# Patient Record
Sex: Female | Born: 1975 | Race: Black or African American | Hispanic: No | Marital: Married | Smoking: Never smoker
Health system: Southern US, Community
[De-identification: ages and names within clinical notes are randomized; demographics above are authoritative.]

## PROBLEM LIST (undated history)

## (undated) DIAGNOSIS — I10 Essential (primary) hypertension: Secondary | ICD-10-CM

## (undated) DIAGNOSIS — D649 Anemia, unspecified: Secondary | ICD-10-CM

## (undated) HISTORY — PX: BREAST SURGERY: SHX581

## (undated) HISTORY — PX: TREATMENT FISTULA ANAL: SUR1390

## (undated) HISTORY — DX: Anemia, unspecified: D64.9

---

## 2007-03-15 ENCOUNTER — Encounter (INDEPENDENT_AMBULATORY_CARE_PROVIDER_SITE_OTHER): Payer: Self-pay | Admitting: Specialist

## 2007-03-15 ENCOUNTER — Ambulatory Visit (HOSPITAL_BASED_OUTPATIENT_CLINIC_OR_DEPARTMENT_OTHER): Admission: RE | Admit: 2007-03-15 | Discharge: 2007-03-16 | Payer: Self-pay | Admitting: Specialist

## 2007-08-13 ENCOUNTER — Ambulatory Visit (HOSPITAL_COMMUNITY): Admission: RE | Admit: 2007-08-13 | Discharge: 2007-08-13 | Payer: Self-pay | Admitting: Obstetrics

## 2007-08-13 ENCOUNTER — Encounter (INDEPENDENT_AMBULATORY_CARE_PROVIDER_SITE_OTHER): Payer: Self-pay | Admitting: Obstetrics

## 2010-07-16 NOTE — Op Note (Signed)
Tracy Terrell, Tracy Terrell             ACCOUNT NO.:  [id number]   MEDICAL RECORD NO.:  [id number]          PATIENT TYPE:  AMB   LOCATION:  DSC                          FACILITY:  [facility]   PHYSICIAN:  Earvin Hansen L. Truesdale, M.D.DATE OF BIRTH:  Oct 24, 1975   DATE OF PROCEDURE:  03/15/2007  DATE OF DISCHARGE:                               OPERATIVE REPORT   PREOPERATIVE DIAGNOSIS:   POSTOPERATIVE DIAGNOSIS:   OPERATION PERFORMED:  Bilateral breast reductions using the inferior  pedicle technique, bilateral excision of accessory breast tissue.   SURGEON:  Yaakov Guthrie. Shon Hough, M.D.   ASSISTANT:   ANESTHESIA:  General.   The patient underwent general anesthesia after she was drawn for the  inferior pedicle reduction mammoplasty, remarking the nipple areolar  complex from 40 cm up to about 23 cm.   COMPLICATIONS:   INDICATIONS FOR PROCEDURE:  A 35 year old lady with severe, severe  macromastia, back and shoulder pain secondary to large pendulous breasts  resistant to conservative treatment.  She has tried creams, talcs, etc.  to try to prevent intertriginous changes under her breast areas and  medially with fail.  She has increased accessory breast tissue that  transcends around the whole left and right side, axillary and latissimus  dorsi regions.   DESCRIPTION OF PROCEDURE:  After general anesthesia, prep was done to  the chest, breast areas with Hibiclens soap and solution, walled off  with sterile towels and drape so as to make a sterile field.  0.25%  Xylocaine with epinephrine 1:400,000 was injected locally 150 mL per  side.  After sterile prep had been done, the wounds were scored with #15  blades and the skin of the inferior pedicles de-epithelialized with #20  blades, medial and lateral fatty dermal pedicles were excised down to  underlying fascia.  Hemostasis maintained with the Bovie unit on  coagulation.  The new key hole area was also debulked and after proper  hemostasis,  excess lateral accessory breast tissue was removed in large  amounts.  Irrigation was done after proper hemostasis.  The flaps were  transposed and stayed with 3-0 Prolene.  Subcutaneous closure was done  with 3-0 Monocryl x2 layers and a running subcuticular stitch of 3-0  Monocryl and 5-0 Monocryl throughout the inverted T.  The wounds were  drained with #10 fully fluted Blake drains which were placed in the  depths of the wound and brought out through the lateral most portion of  the  incision and secured with 3-0 Prolene.  The wounds were cleansed.  Steri-  Strips and soft dressings were applied including Xeroform, 4 x 4s, ABDs,  and Hypafix tape.  She was then taken to recovery in excellent  condition.  The estimated blood loss less than 150 mL.  Complications  none.      Yaakov Guthrie. Shon Hough, M.D.  Electronically Signed     GLT/MEDQ  D:  03/15/2007  T:  03/15/2007  Job:  [id number]

## 2010-07-16 NOTE — Op Note (Signed)
[name], MCFALL NO.:  [id number]   MEDICAL RECORD NO.:  [id number]          PATIENT TYPE:  AMB   LOCATION:  SDC                           FACILITY:  [facility]   PHYSICIAN:  Lendon Colonel, MD   DATE OF BIRTH:  1975/12/26   DATE OF PROCEDURE:  08/13/2007  DATE OF DISCHARGE:                               OPERATIVE REPORT   PREOPERATIVE DIAGNOSIS:  Eight week and 5-day missed abortion.   POSTOPERATIVE DIAGNOSIS:  Eight week and 5-day missed abortion.   PROCEDURE:  Suction, dilatation, and curettage.   FINDINGS:  An 9 weeks size uterus, moderate amount of POCs, hemostasis  post procedure.   ANESTHESIA:  General plus local.   ANTIBIOTICS:  100 mg IV doxycycline.   EBL:  Minimal.   COMPLICATIONS:  None.   PATHOLOGY:  POCs.   PROCEDURE:  After informed consent was obtained, risks benefits, and  alternatives of the procedure was discussed with the patient.  The  patient was taken to the operating room where general anesthesia was  administered without difficulty.  She was prepped and draped in normal  sterile fashion in dorsal supine lithotomy position.  A bimanual  examination was done to assess the size and position of the uterus.  The  cervix was dilated with Pratt dilators to the #27.  A #9 French suction  curette was inserted into the uterus, just passed through the internal  os without resistance and with three passes, suction was used to remove  the products of conception.  A very gentle sharp curettage was done to  note a gritty cry.  The tenaculum was removed and the procedure was  terminated.   Bimanual examination post procedure revealed smaller uterus and good  hemostasis was noted.  Preprocedure, the patient did receive 1%  lidocaine 5 mL to cervical/paracervical junction at 5 and 7o'clock.  The  patient was awakened from the general anesthesia, having tolerated  procedure well.  Sponge, lap, and needle counts were corrected x3.  The  patient  was taken to recovery room in stable condition.      Lendon Colonel, MD  Electronically Signed     KAF/MEDQ  D:  08/13/2007  T:  08/14/2007  Job:  [id number]

## 2010-11-21 LAB — I-STAT 8, (EC8 V) (CONVERTED LAB)
Acid-Base Excess: 2
Chloride: 104
HCT: 46
Operator id: 123621
Potassium: 3.5
Sodium: 138

## 2010-11-28 LAB — BASIC METABOLIC PANEL
Chloride: 98
Creatinine, Ser: 0.75
GFR calc Af Amer: >60
GFR calc non Af Amer: >60
Potassium: 3.7

## 2010-11-28 LAB — CBC
Platelets: 283
RDW: 14.4
WBC: 8.3

## 2010-11-28 LAB — ABO/RH: ABO/RH(D): O POS

## 2011-09-26 ENCOUNTER — Ambulatory Visit: Payer: Self-pay | Admitting: Gastroenterology

## 2011-12-02 ENCOUNTER — Ambulatory Visit: Payer: Self-pay | Admitting: Gastroenterology

## 2013-01-21 ENCOUNTER — Other Ambulatory Visit: Payer: Self-pay | Admitting: Obstetrics

## 2013-01-24 ENCOUNTER — Encounter (HOSPITAL_COMMUNITY): Payer: Self-pay | Admitting: Pharmacist

## 2013-01-24 ENCOUNTER — Encounter (HOSPITAL_COMMUNITY): Payer: Self-pay | Admitting: *Deleted

## 2013-01-25 ENCOUNTER — Other Ambulatory Visit (HOSPITAL_COMMUNITY): Payer: Self-pay | Admitting: Obstetrics

## 2013-01-26 ENCOUNTER — Encounter (HOSPITAL_COMMUNITY): Payer: Managed Care, Other (non HMO) | Admitting: Anesthesiology

## 2013-01-26 ENCOUNTER — Ambulatory Visit (HOSPITAL_COMMUNITY): Payer: Managed Care, Other (non HMO)

## 2013-01-26 ENCOUNTER — Encounter (HOSPITAL_COMMUNITY): Payer: Self-pay | Admitting: Anesthesiology

## 2013-01-26 ENCOUNTER — Ambulatory Visit (HOSPITAL_COMMUNITY)
Admission: RE | Admit: 2013-01-26 | Discharge: 2013-01-26 | Disposition: A | Discharge status: Home / Self Care | Payer: Managed Care, Other (non HMO) | Source: Ambulatory Visit | Attending: Obstetrics | Admitting: Obstetrics

## 2013-01-26 ENCOUNTER — Ambulatory Visit (HOSPITAL_COMMUNITY): Payer: Managed Care, Other (non HMO) | Admitting: Anesthesiology

## 2013-01-26 ENCOUNTER — Encounter (HOSPITAL_COMMUNITY)
Admission: RE | Disposition: A | Discharge status: Home / Self Care | Payer: Self-pay | Source: Ambulatory Visit | Attending: Obstetrics

## 2013-01-26 DIAGNOSIS — Z302 Encounter for sterilization: Secondary | ICD-10-CM | POA: Insufficient documentation

## 2013-01-26 DIAGNOSIS — O021 Missed abortion: Secondary | ICD-10-CM | POA: Insufficient documentation

## 2013-01-26 HISTORY — PX: LAPAROSCOPIC TUBAL LIGATION: SHX1937

## 2013-01-26 HISTORY — DX: Essential (primary) hypertension: I10

## 2013-01-26 HISTORY — PX: DILATION AND EVACUATION: SHX1459

## 2013-01-26 LAB — CBC
HCT: 40.5 % (ref 36.0–46.0)
Hemoglobin: 13.8 g/dL (ref 12.0–15.0)
MCHC: 34.1 g/dL (ref 30.0–36.0)
RBC: 4.8 MIL/uL (ref 3.87–5.11)

## 2013-01-26 LAB — TYPE AND SCREEN
ABO/RH(D): O POS
Antibody Screen: NEGATIVE

## 2013-01-26 SURGERY — DILATION AND EVACUATION, UTERUS
Anesthesia: General | Wound class: Clean Contaminated

## 2013-01-26 MED ORDER — BUPIVACAINE HCL (PF) 0.25 % IJ SOLN
INTRAMUSCULAR | Status: DC | PRN
  Administered 2013-01-26 (×2): 10 mL

## 2013-01-26 MED ORDER — FENTANYL CITRATE 0.05 MG/ML IJ SOLN
INTRAMUSCULAR | Status: AC
  Filled 2013-01-26: qty 5

## 2013-01-26 MED ORDER — DEXAMETHASONE SODIUM PHOSPHATE 10 MG/ML IJ SOLN
INTRAMUSCULAR | Status: DC | PRN
  Administered 2013-01-26: 10 mg via INTRAVENOUS

## 2013-01-26 MED ORDER — NEOSTIGMINE METHYLSULFATE 1 MG/ML IJ SOLN
INTRAMUSCULAR | Status: AC
  Filled 2013-01-26: qty 1

## 2013-01-26 MED ORDER — DEXAMETHASONE SODIUM PHOSPHATE 10 MG/ML IJ SOLN
INTRAMUSCULAR | Status: AC
  Filled 2013-01-26: qty 1

## 2013-01-26 MED ORDER — FENTANYL CITRATE 0.05 MG/ML IJ SOLN
INTRAMUSCULAR | Status: DC | PRN
  Administered 2013-01-26: 150 ug via INTRAVENOUS
  Administered 2013-01-26 (×2): 50 ug via INTRAVENOUS
  Administered 2013-01-26 (×2): 100 ug via INTRAVENOUS
  Administered 2013-01-26: 50 ug via INTRAVENOUS

## 2013-01-26 MED ORDER — PROPOFOL 10 MG/ML IV BOLUS
INTRAVENOUS | Status: DC | PRN
  Administered 2013-01-26: 200 mg via INTRAVENOUS

## 2013-01-26 MED ORDER — MIDAZOLAM HCL 5 MG/5ML IJ SOLN
INTRAMUSCULAR | Status: DC | PRN
  Administered 2013-01-26: 2 mg via INTRAVENOUS

## 2013-01-26 MED ORDER — PROMETHAZINE HCL 25 MG/ML IJ SOLN: 6.2500 mg | INTRAMUSCULAR | Status: DC | PRN

## 2013-01-26 MED ORDER — FENTANYL CITRATE 0.05 MG/ML IJ SOLN
INTRAMUSCULAR | Status: AC
  Administered 2013-01-26: 25 ug
  Filled 2013-01-26: qty 2

## 2013-01-26 MED ORDER — METHYLERGONOVINE MALEATE 0.2 MG/ML IJ SOLN
INTRAMUSCULAR | Status: DC | PRN
  Administered 2013-01-26: 0.2 mg via INTRAMUSCULAR

## 2013-01-26 MED ORDER — OXYCODONE-ACETAMINOPHEN 5-325 MG PO TABS: 2.0000 | ORAL_TABLET | Freq: Four times a day (QID) | ORAL | Status: DC | PRN

## 2013-01-26 MED ORDER — SODIUM CHLORIDE 0.9 % IJ SOLN
INTRAMUSCULAR | Status: AC
  Filled 2013-01-26: qty 50

## 2013-01-26 MED ORDER — LIDOCAINE HCL (CARDIAC) 20 MG/ML IV SOLN
INTRAVENOUS | Status: AC
  Filled 2013-01-26: qty 5

## 2013-01-26 MED ORDER — ONDANSETRON HCL 4 MG/2ML IJ SOLN
INTRAMUSCULAR | Status: AC
  Filled 2013-01-26: qty 2

## 2013-01-26 MED ORDER — KETOROLAC TROMETHAMINE 30 MG/ML IJ SOLN
INTRAMUSCULAR | Status: AC
  Filled 2013-01-26: qty 1

## 2013-01-26 MED ORDER — METHYLERGONOVINE MALEATE 0.2 MG PO TABS: 0.2000 mg | ORAL_TABLET | Freq: Three times a day (TID) | ORAL | Status: DC

## 2013-01-26 MED ORDER — METHYLERGONOVINE MALEATE 0.2 MG/ML IJ SOLN
INTRAMUSCULAR | Status: AC
  Filled 2013-01-26: qty 1

## 2013-01-26 MED ORDER — GLYCOPYRROLATE 0.2 MG/ML IJ SOLN
INTRAMUSCULAR | Status: DC | PRN
  Administered 2013-01-26: .8 mg via INTRAVENOUS

## 2013-01-26 MED ORDER — PROPOFOL 10 MG/ML IV EMUL
INTRAVENOUS | Status: AC
  Filled 2013-01-26: qty 20

## 2013-01-26 MED ORDER — DOXYCYCLINE HYCLATE 100 MG IV SOLR
100.0000 mg | Freq: Once | INTRAVENOUS | Status: AC
  Administered 2013-01-26: 100 mg via INTRAVENOUS
  Filled 2013-01-26: qty 100

## 2013-01-26 MED ORDER — MIDAZOLAM HCL 2 MG/2ML IJ SOLN
INTRAMUSCULAR | Status: AC
  Filled 2013-01-26: qty 2

## 2013-01-26 MED ORDER — BUPIVACAINE HCL (PF) 0.25 % IJ SOLN
INTRAMUSCULAR | Status: AC
  Filled 2013-01-26: qty 30

## 2013-01-26 MED ORDER — SILVER NITRATE-POT NITRATE 75-25 % EX MISC
CUTANEOUS | Status: AC
  Filled 2013-01-26: qty 1

## 2013-01-26 MED ORDER — LACTATED RINGERS IV SOLN
INTRAVENOUS | Status: DC
  Administered 2013-01-26 (×3): via INTRAVENOUS

## 2013-01-26 MED ORDER — GLYCOPYRROLATE 0.2 MG/ML IJ SOLN
INTRAMUSCULAR | Status: AC
  Filled 2013-01-26: qty 4

## 2013-01-26 MED ORDER — LIDOCAINE HCL 1 % IJ SOLN
INTRAMUSCULAR | Status: AC
  Filled 2013-01-26: qty 20

## 2013-01-26 MED ORDER — KETOROLAC TROMETHAMINE 30 MG/ML IJ SOLN
INTRAMUSCULAR | Status: DC | PRN
  Administered 2013-01-26: 30 mg via INTRAVENOUS

## 2013-01-26 MED ORDER — LIDOCAINE HCL (CARDIAC) 20 MG/ML IV SOLN
INTRAVENOUS | Status: DC | PRN
  Administered 2013-01-26: 60 mg via INTRAVENOUS

## 2013-01-26 MED ORDER — MIDAZOLAM HCL 2 MG/2ML IJ SOLN: 0.5000 mg | Freq: Once | INTRAMUSCULAR | Status: DC | PRN

## 2013-01-26 MED ORDER — ONDANSETRON HCL 4 MG/2ML IJ SOLN
INTRAMUSCULAR | Status: DC | PRN
  Administered 2013-01-26: 4 mg via INTRAVENOUS

## 2013-01-26 MED ORDER — FENTANYL CITRATE 0.05 MG/ML IJ SOLN
25.0000 ug | INTRAMUSCULAR | Status: DC | PRN
  Administered 2013-01-26 (×3): 25 ug via INTRAVENOUS

## 2013-01-26 MED ORDER — NEOSTIGMINE METHYLSULFATE 1 MG/ML IJ SOLN
INTRAMUSCULAR | Status: DC | PRN
  Administered 2013-01-26: 4 mg via INTRAVENOUS

## 2013-01-26 MED ORDER — ROCURONIUM BROMIDE 100 MG/10ML IV SOLN
INTRAVENOUS | Status: DC | PRN
  Administered 2013-01-26: 10 mg via INTRAVENOUS
  Administered 2013-01-26: 50 mg via INTRAVENOUS

## 2013-01-26 SURGICAL SUPPLY — 40 items
BLADE SURG 15 STRL LF C SS BP (BLADE) ×2 IMPLANT
BLADE SURG 15 STRL SS (BLADE) ×1
CATH ROBINSON RED A/P 16FR (CATHETERS) ×3 IMPLANT
CHLORAPREP W/TINT 26ML (MISCELLANEOUS) ×3 IMPLANT
CLOTH BEACON ORANGE TIMEOUT ST (SAFETY) ×3 IMPLANT
DECANTER SPIKE VIAL GLASS SM (MISCELLANEOUS) IMPLANT
DERMABOND ADHESIVE PROPEN (GAUZE/BANDAGES/DRESSINGS) ×1
DERMABOND ADVANCED (GAUZE/BANDAGES/DRESSINGS) ×1
DERMABOND ADVANCED .7 DNX12 (GAUZE/BANDAGES/DRESSINGS) ×2 IMPLANT
DERMABOND ADVANCED .7 DNX6 (GAUZE/BANDAGES/DRESSINGS) ×2 IMPLANT
GLOVE BIO SURGEON STRL SZ 6.5 (GLOVE) ×3 IMPLANT
GLOVE BIOGEL PI IND STRL 7.0 (GLOVE) ×4 IMPLANT
GLOVE BIOGEL PI INDICATOR 7.0 (GLOVE) ×2
GOWN PREVENTION PLUS LG XLONG (DISPOSABLE) ×6 IMPLANT
GOWN STRL REIN XL XLG (GOWN DISPOSABLE) ×6 IMPLANT
KIT BERKELEY 1ST TRIMESTER 3/8 (MISCELLANEOUS) ×3 IMPLANT
MANIPULATOR UTERINE 4.5 ZUMI (MISCELLANEOUS) ×3 IMPLANT
NEEDLE INSUFFLATION 120MM (ENDOMECHANICALS) ×3 IMPLANT
NEEDLE SPNL 22GX3.5 QUINCKE BK (NEEDLE) ×3 IMPLANT
NS IRRIG 1000ML POUR BTL (IV SOLUTION) ×3 IMPLANT
PACK LAPAROSCOPY BASIN (CUSTOM PROCEDURE TRAY) ×3 IMPLANT
PACK VAGINAL MINOR WOMEN LF (CUSTOM PROCEDURE TRAY) ×3 IMPLANT
PAD OB MATERNITY 4.3X12.25 (PERSONAL CARE ITEMS) ×3 IMPLANT
PAD PREP 24X48 CUFFED NSTRL (MISCELLANEOUS) ×3 IMPLANT
RING FALLOPIAN BANDS (Ring) ×6 IMPLANT
SET BERKELEY SUCTION TUBING (SUCTIONS) ×3 IMPLANT
SUT VICRYL 0 UR6 27IN ABS (SUTURE) ×3 IMPLANT
SUT VICRYL 4-0 PS2 18IN ABS (SUTURE) ×6 IMPLANT
SYR CONTROL 10ML LL (SYRINGE) ×3 IMPLANT
SYRINGE 10CC LL (SYRINGE) ×3 IMPLANT
TOWEL OR 17X24 6PK STRL BLUE (TOWEL DISPOSABLE) ×6 IMPLANT
TROCAR XCEL NON BLADE 8MM B8LT (ENDOMECHANICALS) ×3 IMPLANT
TROCAR XCEL NON-BLD 5MMX100MML (ENDOMECHANICALS) ×3 IMPLANT
VACURETTE 10 RIGID CVD (CANNULA) IMPLANT
VACURETTE 14MM CVD 1/2 BASE (CANNULA) IMPLANT
VACURETTE 7MM CVD STRL WRAP (CANNULA) ×3 IMPLANT
VACURETTE 8 RIGID CVD (CANNULA) IMPLANT
VACURETTE 9 RIGID CVD (CANNULA) IMPLANT
WARMER LAPAROSCOPE (MISCELLANEOUS) ×3 IMPLANT
WATER STERILE IRR 1000ML POUR (IV SOLUTION) ×3 IMPLANT

## 2013-01-26 NOTE — Anesthesia Preprocedure Evaluation (Signed)
Anesthesia Evaluation  Patient identified by MRN, date of birth, ID band Patient awake    Reviewed: Allergy & Precautions, H&P , Patient's Chart, lab work & pertinent test results, reviewed documented beta blocker date and time   History of Anesthesia Complications Negative for: history of anesthetic complications  Airway Mallampati: II TM Distance: >3 FB Neck ROM: full    Dental   Pulmonary  breath sounds clear to auscultation        Cardiovascular Exercise Tolerance: Good hypertension, Rhythm:regular Rate:Normal     Neuro/Psych    GI/Hepatic   Endo/Other    Renal/GU      Musculoskeletal   Abdominal   Peds  Hematology   Anesthesia Other Findings   Reproductive/Obstetrics                           Anesthesia Physical Anesthesia Plan  ASA: II  Anesthesia Plan: General ETT   Post-op Pain Management:    Induction:   Airway Management Planned:   Additional Equipment:   Intra-op Plan:   Post-operative Plan:   Informed Consent: I have reviewed the patients History and Physical, chart, labs and discussed the procedure including the risks, benefits and alternatives for the proposed anesthesia with the patient or authorized representative who has indicated his/her understanding and acceptance.   Dental Advisory Given  Plan Discussed with: CRNA and Surgeon  Anesthesia Plan Comments:         Anesthesia Quick Evaluation  

## 2013-01-26 NOTE — Brief Op Note (Signed)
01/26/2013  1:20 PM  PATIENT:  Tracy Terrell  37 y.o. female  PRE-OPERATIVE DIAGNOSIS:  Missed Abortion, Desires Sterilization  [phone], [phone]  POST-OPERATIVE DIAGNOSIS:  MISSED ABORTION, DESIRES STERILIZATION  PROCEDURE:  Procedure(s): DILATATION AND EVACUATION WITH OPERATIVE ULTRASOUND (N/A) LAPAROSCOPIC TUBAL LIGATION (Bilateral)  SURGEON:  Surgeon(s) and Role:    * Jolyne Laye A. Ernestina Penna, MD - Primary  PHYSICIAN ASSISTANT:   ASSISTANTS: none   ANESTHESIA:   general  EBL:  Total I/O In: 2250 [I.V.:2250] Out: 175 [Urine:100; Blood:75]  BLOOD ADMINISTERED:none  DRAINS: Urinary Catheter (Foley)   LOCAL MEDICATIONS USED:  MARCAINE     SPECIMEN:  Source of Specimen:  POC's  DISPOSITION OF SPECIMEN:  PATHOLOGY  COUNTS:  YES  TOURNIQUET:  * No tourniquets in log *  DICTATION: .Note written in EPIC  PLAN OF CARE: Admit to inpatient   PATIENT DISPOSITION:  PACU - hemodynamically stable.   Delay start of Pharmacological VTE agent (>24hrs) due to surgical blood loss or risk of bleeding: yes

## 2013-01-26 NOTE — H&P (Signed)
Babs Dabbs is a 37 y.o. [id number] with MAB presenting for D&C. Pt also desires sterilization by lap TL at time of D&C. Known fibroids. H/o htn, currently on no meds.    Past Medical History  Diagnosis Date  . Hypertension     controlled currently w/o meds   Past Surgical History  Procedure Laterality Date  . Breast surgery    . Treatment fistula anal     Family History: family history is not on file. Social History:  reports that she has never smoked. She does not have any smokeless tobacco history on file. She reports that she does not drink alcohol or use illicit drugs.  PMH: fibroids, htn   Meds: PNV Allergies: ASA, Demerol  Review of Systems - Negative except none     Blood pressure 125/74, pulse 102, temperature 98.6 F (37 C), temperature source Oral, resp. rate 20, height 5' (1.524 m), weight 97.07 kg (214 lb), SpO2 100.00%.  Physical Exam:  Gen: well appearing, no distress  Back: no CVAT Abd:  NT, no RUQ pain LE: no edema, equal bilaterally, non-tender GU: deferred to OR    Assessment/Plan: 37 y.o. No obstetric history on file. here for u/s guided D&C for MAB and lap TL for undesired fertility R/B reviewed w/ pt. Proceed as planned   Rasheen Schewe A. 01/26/2013, 9:36 AM

## 2013-01-26 NOTE — Op Note (Signed)
01/26/2013  1:20 PM  PATIENT:  Tracy Terrell  37 y.o. female  PRE-OPERATIVE DIAGNOSIS:  Missed Abortion, Desires Sterilization  [phone], [phone]  POST-OPERATIVE DIAGNOSIS:  MISSED ABORTION, DESIRES STERILIZATION  PROCEDURE:  Procedure(s): DILATATION AND EVACUATION WITH OPERATIVE ULTRASOUND (N/A) LAPAROSCOPIC TUBAL LIGATION (Bilateral)  SURGEON:  Surgeon(s) and Role:    * Isabelle Matt A. Ernestina Penna, MD - Primary  PHYSICIAN ASSISTANT:   ASSISTANTS: none   ANESTHESIA:   general  EBL:  Total I/O In: 2250 [I.V.:2250] Out: 175 [Urine:100; Blood:75]  BLOOD ADMINISTERED:none  DRAINS: Urinary Catheter (Foley)   LOCAL MEDICATIONS USED:  MARCAINE     SPECIMEN:  Source of Specimen:  POC's  DISPOSITION OF SPECIMEN:  PATHOLOGY  COUNTS:  YES  TOURNIQUET:  * No tourniquets in log *  DICTATION: .Note written in EPIC  PLAN OF CARE: Admit to inpatient   PATIENT DISPOSITION:  PACU - hemodynamically stable.   Delay start of Pharmacological VTE agent (>24hrs) due to surgical blood loss or risk of bleeding: yes  Antibiotics: 100 mg IV doxycycline EBL: Minimal, prior anesthesia Complications: None Findings: Fundus palpable to the umbilicus, multiple fibroids in uterus, 9 weeks failed IUP, thickened endometrial stripe with hemostasis postpartum. By laparoscopy, normal bilateral ovaries, normal bilateral tubes, normal liver edge and gallbladder, markedly enlarged fibroid uterus.  Indications: This is a 37 year old G4 P1 031 with 9 week missed AB and multiple fibroids who presents for ultrasound-guided D&E of missed abortion and sterilization via laparoscopic tubal ligation. Risks and benefits along with alternatives have been discussed with patient previously.  Procedure: After informed consent was obtained and discussion of alternatives were reviewed, patient was taken to the operating room where general anesthesia was initiated without difficulty she was prepped and draped in the normal sterile  fashion in dorsal lithotomy. A speculum was placed in the vagina although Several different speculums were tried due to the redundant vaginal tissue to to patient's obesity and the length of the vagina. Exposure was limited but best obtained with a medium weighted speculum and a Deaver to retract the anterior vaginal l wall. At this point the cervix was visualized and grasped with tenaculum. 10 cc of quarter percent Marcaine were infused at 5 and 7:00 the cervical paracervical junction and Betadine was applied. Using ultrasound guidance the cervix was easily dilated to a #27 Pratt dilator. This was only accomplished with retraction on the cervix. Even with this and with fundal pressure to push the uterus into the lower pelvis,  the full length of the dilators were inside the patient's vagina due to the length of the vagina the lip of the cervix and the length of the uterus. Under ultrasound guidance a 9 French suction curet was advanced into the lower uterine segment. Ultrasound was used to guide the suction curet past the lower segment fibroid and into the sac of the pregnancy. Suction was turned on however the suction curet was entirely within the vagina and any manipulation of the suction curet caused release of the suction valve. Eventually a piece of Tegaderm was used to secure the closure device over the valve so it would not move and it intravaginal position. Several passes with the suction curet were done to complete the suction curettage. A sharp curettage was also carried out under ultrasound guidance. Thinned endometrial stripe with no active flow was noted as well as hemostasis at the end of the procedure. The patient tolerated the procedure well.  Townsend gloves were changed and the patient was reprepped and draped  for the laparoscopic tubal ligation. A ZUMI uterine manipulator was placed into the uterus with oh given the length of the cervix I believe this device remains within the cervix and  limited R. uterine manipulation. A Foley catheter was also placed. A 5 mm skin incision was made above the umbilicus all fold and a varies needle was inserted into the peritoneal cavity intraperitoneal placement was confirmed with the use of a saline filled syringe and pneumoperitoneum to 15 mmHg was created. A 5 mm Optiview non-bladed trocar was then used to enter the pelvis. Intraperitoneal placement was confirmed with the use of the laparoscope a pneumoperitoneum was maintained brief survey of the abdomen and pelvis revealed normal liver edge, normal gallbladder, normal bilateral tubes and ovaries, markedly enlarged uterus at the level of the umbilicus. An 8 mm non-bladed trocar was placed in the suprapubic incision. Using blunt probes and limited uterine manipulation from the ZUMI the bilateral fallopian tubes are carried out to the fimbriated end. Using Falope ring applicator the right tube was grasped and a section of tube was pulled up into the Falope ring applicator. At the conclusion of the application I was concerned that we did not get full thickness of the fallopian tube in the second ring was applied with excellent effect. The left tube was pulled up into the Falope ring and application of the Falope ring with good blanching and a good knuckle of tube pulled up into the ring. Inspection of the pelvis then revealed a small puncture wounds on the back of the uterus. This was thought to be from initial entry with the varies needle. Pneumoperitoneum was completely released and no bleeding was noted from the site hemostasis was assured. 60 cc of saline was inserted into the pelvis and then suctioned out with the removal of a single clot. The posterior cul-de-sac was not able to be evaluated due to the size of the uterus and the limited mobility with the ZUMI manipulator. The anterior cul-de-sac was clear. The procedure was then terminated. Pneumoperitoneum was again released trochars were removed. The skin  sites were closed with 4-0 Monocryl after assuring hemostasis of the skin sites.  The ZUMI manipulator was removed. No vaginal bleeding was noted. The procedure was terminated. Sponge lap needle counts were correct x3 and patient was taken to recovery room in a stable condition.  Tracy Terrell A. 01/26/2013 1:33 PM

## 2013-01-26 NOTE — Anesthesia Postprocedure Evaluation (Signed)
  Anesthesia Post-op Note  Anesthesia Post Note  Patient: Tracy Terrell  Procedure(s) Performed: Procedure(s) (LRB): DILATATION AND EVACUATION WITH OPERATIVE ULTRASOUND (N/A) LAPAROSCOPIC TUBAL LIGATION (Bilateral)  Anesthesia type: General  Patient location: PACU  Post pain: Pain level controlled  Post assessment: Post-op Vital signs reviewed  Last Vitals:  Filed Vitals:   01/26/13 1315  BP: 143/90  Pulse: 100  Temp: 37.4 C  Resp: 16    Post vital signs: Reviewed  Level of consciousness: sedated  Complications: No apparent anesthesia complications

## 2013-01-26 NOTE — Transfer of Care (Signed)
Immediate Anesthesia Transfer of Care Note  Patient: Tracy Terrell  Procedure(s) Performed: Procedure(s): DILATATION AND EVACUATION WITH OPERATIVE ULTRASOUND (N/A) LAPAROSCOPIC TUBAL LIGATION (Bilateral)  Patient Location: PACU  Anesthesia Type:General  Level of Consciousness: awake  Airway & Oxygen Therapy: Patient Spontanous Breathing and Patient connected to nasal cannula oxygen  Post-op Assessment: Report given to PACU RN and Post -op Vital signs reviewed and stable  Post vital signs: stable  Complications: No apparent anesthesia complications

## 2013-01-27 ENCOUNTER — Encounter (HOSPITAL_COMMUNITY): Payer: Self-pay | Admitting: Obstetrics

## 2018-11-25 DIAGNOSIS — Z800 Family history of malignant neoplasm of digestive organs: Secondary | ICD-10-CM | POA: Insufficient documentation

## 2018-11-25 DIAGNOSIS — D500 Iron deficiency anemia secondary to blood loss (chronic): Secondary | ICD-10-CM | POA: Insufficient documentation

## 2018-11-25 DIAGNOSIS — K649 Unspecified hemorrhoids: Secondary | ICD-10-CM | POA: Insufficient documentation

## 2018-11-25 DIAGNOSIS — N924 Excessive bleeding in the premenopausal period: Secondary | ICD-10-CM | POA: Insufficient documentation

## 2018-11-28 DIAGNOSIS — Z9851 Tubal ligation status: Secondary | ICD-10-CM | POA: Insufficient documentation

## 2018-12-09 DIAGNOSIS — D509 Iron deficiency anemia, unspecified: Secondary | ICD-10-CM | POA: Insufficient documentation

## 2020-01-19 LAB — RESULTS CONSOLE HPV: CHL HPV: POSITIVE

## 2020-01-19 LAB — HM PAP SMEAR

## 2020-09-21 ENCOUNTER — Other Ambulatory Visit: Payer: Self-pay

## 2020-09-21 ENCOUNTER — Emergency Department (HOSPITAL_COMMUNITY)
Admission: EM | Admit: 2020-09-21 | Discharge: 2020-09-22 | Disposition: A | Discharge status: Home / Self Care | Payer: BC Managed Care – PPO | Attending: Emergency Medicine | Admitting: Emergency Medicine

## 2020-09-21 ENCOUNTER — Encounter (HOSPITAL_COMMUNITY): Payer: Self-pay | Admitting: Emergency Medicine

## 2020-09-21 ENCOUNTER — Emergency Department (HOSPITAL_COMMUNITY): Payer: BC Managed Care – PPO

## 2020-09-21 DIAGNOSIS — R0602 Shortness of breath: Secondary | ICD-10-CM | POA: Diagnosis not present

## 2020-09-21 DIAGNOSIS — R0789 Other chest pain: Secondary | ICD-10-CM

## 2020-09-21 DIAGNOSIS — E041 Nontoxic single thyroid nodule: Secondary | ICD-10-CM | POA: Diagnosis not present

## 2020-09-21 DIAGNOSIS — I10 Essential (primary) hypertension: Secondary | ICD-10-CM | POA: Diagnosis not present

## 2020-09-21 DIAGNOSIS — U071 COVID-19: Secondary | ICD-10-CM

## 2020-09-21 DIAGNOSIS — R7989 Other specified abnormal findings of blood chemistry: Secondary | ICD-10-CM | POA: Diagnosis not present

## 2020-09-21 DIAGNOSIS — R911 Solitary pulmonary nodule: Secondary | ICD-10-CM | POA: Diagnosis not present

## 2020-09-21 LAB — COMPREHENSIVE METABOLIC PANEL
ALT: 28 U/L (ref 0–44)
AST: 38 U/L (ref 15–41)
Albumin: 3.5 g/dL (ref 3.5–5.0)
Alkaline Phosphatase: 89 U/L (ref 38–126)
Anion gap: 6 (ref 5–15)
BUN: 14 mg/dL (ref 6–20)
CO2: 23 mmol/L (ref 22–32)
Calcium: 8.3 mg/dL — ABNORMAL LOW (ref 8.9–10.3)
Chloride: 106 mmol/L (ref 98–111)
Creatinine, Ser: 0.94 mg/dL (ref 0.44–1.00)
GFR, Estimated: >60 mL/min (ref >60)
Glucose, Bld: 103 mg/dL — ABNORMAL HIGH (ref 70–99)
Potassium: 3 mmol/L — ABNORMAL LOW (ref 3.5–5.1)
Sodium: 135 mmol/L (ref 135–145)
Total Bilirubin: 0.2 mg/dL — ABNORMAL LOW (ref 0.3–1.2)
Total Protein: 7.8 g/dL (ref 6.5–8.1)

## 2020-09-21 LAB — CBC WITH DIFFERENTIAL/PLATELET
Abs Immature Granulocytes: 0.02 10*3/uL (ref 0.00–0.07)
Basophils Absolute: 0 10*3/uL (ref 0.0–0.1)
Basophils Relative: 1 %
Eosinophils Absolute: 0.1 10*3/uL (ref 0.0–0.5)
Eosinophils Relative: 1 %
HCT: 29.7 % — ABNORMAL LOW (ref 36.0–46.0)
Hemoglobin: 8.7 g/dL — ABNORMAL LOW (ref 12.0–15.0)
Immature Granulocytes: 0 %
Lymphocytes Relative: 22 %
Lymphs Abs: 1.8 10*3/uL (ref 0.7–4.0)
MCH: 21.2 pg — ABNORMAL LOW (ref 26.0–34.0)
MCHC: 29.3 g/dL — ABNORMAL LOW (ref 30.0–36.0)
MCV: 72.4 fL — ABNORMAL LOW (ref 80.0–100.0)
Monocytes Absolute: 1 10*3/uL (ref 0.1–1.0)
Monocytes Relative: 11 %
Neutro Abs: 5.6 10*3/uL (ref 1.7–7.7)
Neutrophils Relative %: 65 %
Platelets: 425 10*3/uL — ABNORMAL HIGH (ref 150–400)
RBC: 4.1 MIL/uL (ref 3.87–5.11)
RDW: 20.7 % — ABNORMAL HIGH (ref 11.5–15.5)
WBC: 8.5 10*3/uL (ref 4.0–10.5)
nRBC: 0 % (ref 0.0–0.2)

## 2020-09-21 LAB — BRAIN NATRIURETIC PEPTIDE: B Natriuretic Peptide: 22 pg/mL (ref 0.0–100.0)

## 2020-09-21 LAB — TROPONIN I (HIGH SENSITIVITY): Troponin I (High Sensitivity): 8 ng/L (ref <18)

## 2020-09-21 NOTE — ED Triage Notes (Signed)
Pt c/o chest pain and sob intermittently over last week.

## 2020-09-22 ENCOUNTER — Emergency Department (HOSPITAL_COMMUNITY): Payer: BC Managed Care – PPO

## 2020-09-22 DIAGNOSIS — R0602 Shortness of breath: Secondary | ICD-10-CM | POA: Diagnosis not present

## 2020-09-22 DIAGNOSIS — R911 Solitary pulmonary nodule: Secondary | ICD-10-CM | POA: Diagnosis not present

## 2020-09-22 DIAGNOSIS — E041 Nontoxic single thyroid nodule: Secondary | ICD-10-CM | POA: Diagnosis not present

## 2020-09-22 DIAGNOSIS — U071 COVID-19: Secondary | ICD-10-CM

## 2020-09-22 DIAGNOSIS — R7989 Other specified abnormal findings of blood chemistry: Secondary | ICD-10-CM | POA: Diagnosis not present

## 2020-09-22 LAB — RETICULOCYTES
Immature Retic Fract: 34.1 % — ABNORMAL HIGH (ref 2.3–15.9)
RBC.: 3.92 MIL/uL (ref 3.87–5.11)
Retic Count, Absolute: 72.5 10*3/uL (ref 19.0–186.0)
Retic Ct Pct: 1.9 % (ref 0.4–3.1)

## 2020-09-22 LAB — PREPARE RBC (CROSSMATCH)

## 2020-09-22 LAB — POC OCCULT BLOOD, ED: Fecal Occult Bld: NEGATIVE

## 2020-09-22 LAB — RESP PANEL BY RT-PCR (FLU A&B, COVID) ARPGX2
Influenza A by PCR: NEGATIVE
Influenza B by PCR: NEGATIVE
SARS Coronavirus 2 by RT PCR: POSITIVE — AB

## 2020-09-22 LAB — IRON AND TIBC
Iron: 67 ug/dL (ref 28–170)
Saturation Ratios: 17 % (ref 10.4–31.8)
TIBC: 397 ug/dL (ref 250–450)
UIBC: 330 ug/dL

## 2020-09-22 LAB — TROPONIN I (HIGH SENSITIVITY)
Troponin I (High Sensitivity): 7 ng/L (ref <18)
Troponin I (High Sensitivity): 8 ng/L (ref <18)

## 2020-09-22 LAB — VITAMIN B12: Vitamin B-12: 377 pg/mL (ref 180–914)

## 2020-09-22 LAB — D-DIMER, QUANTITATIVE: D-Dimer, Quant: 1.19 ug/mL-FEU — ABNORMAL HIGH (ref 0.00–0.50)

## 2020-09-22 LAB — FERRITIN: Ferritin: 4 ng/mL — ABNORMAL LOW (ref 11–307)

## 2020-09-22 LAB — FOLATE: Folate: 15.9 ng/mL (ref >5.9)

## 2020-09-22 MED ORDER — ACETAMINOPHEN 325 MG PO TABS
650.0000 mg | ORAL_TABLET | Freq: Once | ORAL | Status: DC
  Filled 2020-09-22: qty 2

## 2020-09-22 MED ORDER — IOHEXOL 350 MG/ML SOLN
100.0000 mL | Freq: Once | INTRAVENOUS | Status: AC | PRN
  Administered 2020-09-22: 100 mL via INTRAVENOUS

## 2020-09-22 MED ORDER — HYDROCODONE-ACETAMINOPHEN 5-325 MG PO TABS
1.0000 | ORAL_TABLET | Freq: Once | ORAL | Status: AC
Start: 2020-09-22 — End: 2020-09-22
  Administered 2020-09-22: 1 via ORAL
  Filled 2020-09-22: qty 1

## 2020-09-22 MED ORDER — PAXLOVID 10 X 150 MG & 10 X 100MG PO TBPK: 2.0000 | ORAL_TABLET | Freq: Two times a day (BID) | ORAL | 0 refills | Status: AC

## 2020-09-22 MED ORDER — POTASSIUM CHLORIDE CRYS ER 20 MEQ PO TBCR
40.0000 meq | EXTENDED_RELEASE_TABLET | Freq: Once | ORAL | Status: AC
  Administered 2020-09-22: 40 meq via ORAL
  Filled 2020-09-22: qty 2

## 2020-09-22 MED ORDER — MORPHINE SULFATE (PF) 4 MG/ML IV SOLN
4.0000 mg | Freq: Once | INTRAVENOUS | Status: AC
  Administered 2020-09-22: 4 mg via INTRAVENOUS
  Filled 2020-09-22: qty 1

## 2020-09-22 MED ORDER — KETOROLAC TROMETHAMINE 30 MG/ML IJ SOLN
30.0000 mg | Freq: Once | INTRAMUSCULAR | Status: AC
  Administered 2020-09-22: 30 mg via INTRAVENOUS
  Filled 2020-09-22: qty 1

## 2020-09-22 MED ORDER — SODIUM CHLORIDE 0.9 % IV SOLN: 10.0000 mL/h | Freq: Once | INTRAVENOUS | Status: DC

## 2020-09-22 NOTE — ED Provider Notes (Signed)
Signout from Dr. Manus Gunning.  45 year old female here with chest pain shortness of breath.  Found to be COVID-positive and anemic.  Tachycardic with exertion.  Not hypoxic.  Hospitalist did not feel needed medical admission.  Plan is to transfuse 1 unit packed red blood cells and cardiac echo if available. Physical Exam  BP (!) 147/83   Pulse 81   Temp 98.1 F (36.7 C) (Oral)   Resp (!) 23   Ht 5' (1.524 m)   Wt 98.4 kg   LMP 09/02/2020   SpO2 98%   BMI 42.38 kg/m   Physical Exam  ED Course/Procedures     Procedures  MDM  Patient transfused 1 unit packed red blood cells.  Delta troponin flat.  Recommended close follow-up with PCP.  Return instructions discussed       Terrilee Files, MD 09/22/20 1729

## 2020-09-22 NOTE — ED Notes (Signed)
Pt ambulated around nurses station with pulse oximetry. Oxygen stayed at 98% with heart rate increasing from 96 to 115.

## 2020-09-22 NOTE — ED Provider Notes (Addendum)
[facility] EMERGENCY DEPARTMENT Provider Note   CSN: [id number] Arrival date & time: 09/21/20  2233     History Chief Complaint  Patient presents with   Chest Pain    Tracy Terrell is a 45 y.o. female.  Patient with a history of hypertension as well as anemia attributed to heavy periods here with 1 week history of intermittent chest pain or shortness of breath.  States she just returned from a vacation by air today.  Over the past 1 week she has been having intermittent left-sided chest pain underneath her left breast that comes and goes lasting for several minutes at a time.  She estimates the pain as a pressure that last for 15 to 30 minutes at a time.  Associate with some shortness of breath.  Better with rest.  No nausea, vomiting, cough, fever, diaphoresis. Pain is happening 2-3 times daily, not necessarily exertional or pleuritic.  it worse with position changes and ambulation.  Denies any cardiac history. Denies any black or bloody stools.  Denies any blood thinner use.  Denies any history of blood clot.  Has had increased congestion today and some cough. She is never had a stress test  The history is provided by the patient.  Chest Pain Associated symptoms: fatigue and shortness of breath   Associated symptoms: no abdominal pain, no back pain, no dizziness, no headache, no nausea, no vomiting and no weakness       Past Medical History:  Diagnosis Date   Hypertension    controlled currently w/o meds    There are no problems to display for this patient.   Past Surgical History:  Procedure Laterality Date   BREAST SURGERY     DILATION AND EVACUATION N/A 01/26/2013   Procedure: DILATATION AND EVACUATION WITH OPERATIVE ULTRASOUND;  Surgeon: Alphonsus SiasKelly A. Ernestina PennaFogleman, MD;  Location: [facility];  Service: Gynecology;  Laterality: N/A;   LAPAROSCOPIC TUBAL LIGATION Bilateral 01/26/2013   Procedure: LAPAROSCOPIC TUBAL LIGATION;  Surgeon: Tresa EndoKelly A. Ernestina PennaFogleman, MD;  Location: [facility];   Service: Gynecology;  Laterality: Bilateral;   TREATMENT FISTULA ANAL       OB History   No obstetric history on file.     No family history on file.  Social History   Tobacco Use   Smoking status: Never  Substance Use Topics   Alcohol use: No   Drug use: No    Home Medications Prior to Admission medications   Medication Sig Start Date End Date Taking? Authorizing Provider  methylergonovine (METHERGINE) 0.2 MG tablet Take 1 tablet (0.2 mg total) by mouth 3 (three) times daily. 01/26/13   Noland FordyceFogleman, Kelly, MD  oxyCODONE-acetaminophen (ROXICET) 5-325 MG per tablet Take 2 tablets by mouth every 6 (six) hours as needed for severe pain. 01/26/13   Noland FordyceFogleman, Kelly, MD    Allergies    Aspirin and Demerol [meperidine]  Review of Systems   Review of Systems  Constitutional:  Positive for fatigue. Negative for activity change and appetite change.  HENT:  Negative for congestion and rhinorrhea.   Respiratory:  Positive for chest tightness and shortness of breath.   Cardiovascular:  Positive for chest pain.  Gastrointestinal:  Negative for abdominal pain, nausea and vomiting.  Genitourinary:  Negative for dysuria and hematuria.  Musculoskeletal:  Negative for back pain and myalgias.  Skin:  Negative for rash.  Neurological:  Negative for dizziness, weakness and headaches.   all other systems are negative except as noted in the HPI and PMH.  Physical Exam Updated Vital Signs BP (!) 159/92   Pulse 96   Resp (!) 23   Ht 5' (1.524 m)   Wt 98.4 kg   LMP 09/02/2020   SpO2 100%   BMI 42.38 kg/m   Physical Exam Vitals and nursing note reviewed.  Constitutional:      General: She is not in acute distress.    Appearance: She is well-developed.  HENT:     Head: Normocephalic and atraumatic.     Mouth/Throat:     Pharynx: No oropharyngeal exudate.  Eyes:     Conjunctiva/sclera: Conjunctivae normal.     Pupils: Pupils are equal, round, and reactive to light.  Neck:      Comments: No meningismus. Cardiovascular:     Rate and Rhythm: Normal rate and regular rhythm.     Heart sounds: Normal heart sounds. No murmur heard. Pulmonary:     Effort: Pulmonary effort is normal. No respiratory distress.     Breath sounds: Normal breath sounds.  Abdominal:     Palpations: Abdomen is soft.     Tenderness: There is no abdominal tenderness. There is no guarding or rebound.  Genitourinary:    Comments: Chaperone present, Futures trader.  No gross blood.  No hemorrhoids or fissures. Musculoskeletal:        General: No tenderness. Normal range of motion.     Cervical back: Normal range of motion and neck supple.  Skin:    General: Skin is warm.  Neurological:     Mental Status: She is alert and oriented to person, place, and time.     Cranial Nerves: No cranial nerve deficit.     Motor: No abnormal muscle tone.     Coordination: Coordination normal.     Comments:  5/5 strength throughout. CN 2-12 intact.Equal grip strength.   Psychiatric:        Behavior: Behavior normal.    ED Results / Procedures / Treatments   Labs (all labs ordered are listed, but only abnormal results are displayed) Labs Reviewed  RESP PANEL BY RT-PCR (FLU A&B, COVID) ARPGX2 - Abnormal; Notable for the following components:      Result Value   SARS Coronavirus 2 by RT PCR POSITIVE (*)    All other components within normal limits  CBC WITH DIFFERENTIAL/PLATELET - Abnormal; Notable for the following components:   Hemoglobin 8.7 (*)    HCT 29.7 (*)    MCV 72.4 (*)    MCH 21.2 (*)    MCHC 29.3 (*)    RDW 20.7 (*)    Platelets 425 (*)    All other components within normal limits  COMPREHENSIVE METABOLIC PANEL - Abnormal; Notable for the following components:   Potassium 3.0 (*)    Glucose, Bld 103 (*)    Calcium 8.3 (*)    Total Bilirubin 0.2 (*)    All other components within normal limits  D-DIMER, QUANTITATIVE - Abnormal; Notable for the following components:   D-Dimer, Quant 1.19 (*)     All other components within normal limits  FERRITIN - Abnormal; Notable for the following components:   Ferritin 4 (*)    All other components within normal limits  RETICULOCYTES - Abnormal; Notable for the following components:   Immature Retic Fract 34.1 (*)    All other components within normal limits  BRAIN NATRIURETIC PEPTIDE  VITAMIN B12  FOLATE  IRON AND TIBC  POC OCCULT BLOOD, ED  TYPE AND SCREEN  TROPONIN I (HIGH SENSITIVITY)  TROPONIN I (HIGH SENSITIVITY)    EKG EKG Interpretation  Date/Time:  Friday September 21 2020 22:43:13 EDT Ventricular Rate:  115 PR Interval:  149 QRS Duration: 83 QT Interval:  350 QTC Calculation: 485 R Axis:   34 Text Interpretation: Sinus tachycardia Rate faster Confirmed by Glynn Octave [phone]) on 09/22/2020 12:15:29 AM  Radiology CT Angio Chest PE W and/or Wo Contrast  Result Date: 09/22/2020 CLINICAL DATA:  Positive D-dimer. Shortness of breath. Pulmonary embolus suspected. EXAM: CT ANGIOGRAPHY CHEST WITH CONTRAST TECHNIQUE: Multidetector CT imaging of the chest was performed using the standard protocol during bolus administration of intravenous contrast. Multiplanar CT image reconstructions and MIPs were obtained to evaluate the vascular anatomy. CONTRAST:  OMNIPAQUE IOHEXOL 350 MG/ML SOLN COMPARISON:  None. FINDINGS: Cardiovascular: The heart size is normal. No substantial pericardial effusion. No thoracic aortic aneurysm. There is no filling defect within the opacified pulmonary arteries to suggest the presence of an acute pulmonary embolus. Mediastinum/Nodes: 1.8 cm right thyroid nodule evident. No mediastinal lymphadenopathy. There is no hilar lymphadenopathy. The esophagus has normal imaging features. There is no axillary lymphadenopathy. Lungs/Pleura: 2 mm left lower lobe nodule identified on 72/7. No focal airspace consolidation. No pleural effusion. No evidence for pulmonary edema. Upper Abdomen: Unremarkable. Musculoskeletal:  No worrisome lytic or sclerotic osseous abnormality. Review of the MIP images confirms the above findings. IMPRESSION: 1. No CT evidence for acute pulmonary embolus. 2. 2 mm left lower lobe pulmonary nodule. No follow-up needed if patient is low-risk. Non-contrast chest CT can be considered in 12 months if patient is high-risk. This recommendation follows the consensus statement: Guidelines for Management of Incidental Pulmonary Nodules Detected on CT Images: From the Fleischner Society 2017; Radiology 2017; 284:228-243. 3. 1.8 cm right thyroid nodule. Recommend thyroid US (ref: J Am Coll Radiol. 2015 Feb;12(2): 143-50). Electronically Signed   By: Kennith Center M.D.   On: 09/22/2020 07:22   DG Chest Portable 1 View  Result Date: 09/21/2020 CLINICAL DATA:  45 year old female with shortness of breath. EXAM: PORTABLE CHEST 1 VIEW COMPARISON:  None. FINDINGS: Shallow inspiration. No focal consolidation, pleural effusion pneumothorax. The cardiac silhouette is within normal limits. No acute osseous pathology. IMPRESSION: No active disease. Electronically Signed   By: Elgie Collard M.D.   On: 09/21/2020 23:34    Procedures Procedures   Medications Ordered in ED Medications - No data to display  ED Course  I have reviewed the triage vital signs and the nursing notes.  Pertinent labs & imaging results that were available during my care of the patient were reviewed by me and considered in my medical decision making (see chart for details).    MDM Rules/Calculators/A&P                          Intermittent chest pain and shortness of breath for the past 1 week.  Her EKG is sinus tachycardia without acute ST changes.  Hemoglobin is 8.7.  Patient believes her baseline is around 9 or 10.  Last value in the system in 2014 was 13.  States she has never had a blood transfusion.  Becomes quite tachycardic to the 120s and 130s with movement in the bed and as well as short of breath.  Chest x-ray is  negative.  COVID test is positive.  Patient able to ambulate without desaturation however.  Remains tachycardic around 120.  Chest pain may be multifactorial due to her COVID infection as well as relative  anemia.  Patient becomes tachycardic to the 120s and 130s with attempted ambulation.  Does not become hypoxic however.  No indication for steroids.  Will initiate IV remdesivir.  Her chest pain is likely multifactorial due to her COVID infection as well as anemia.  Troponins remain negative.  D-dimer is elevated and CT PE study will be obtained.  Admission discussed with hospitalist service  Discussed with Dr. Gwenlyn Perking 7:45 AM.  He has reviewed patient's chart.  He states he has nothing to offer her as an inpatient.  States she can receive a unit of blood if she is symptomatic from her anemia.  No indication for steroids or remdesivir based on her COVID infection as she is not hypoxic and she has no chest x-ray findings.  He does recommend Paxlovid.   Troponins remain negative.  Unit of blood will be ordered.  CT PE study is negative for pulmonary embolism but does show a lung nodule. Care to be transferred at shift change for discharge after unit of blood and echocardiogram.  MESHELL ABDULAZIZ was evaluated in Emergency Department on 09/22/2020 for the symptoms described in the history of present illness. She was evaluated in the context of the global COVID-19 pandemic, which necessitated consideration that the patient might be at risk for infection with the SARS-CoV-2 virus that causes COVID-19. Institutional protocols and algorithms that pertain to the evaluation of patients at risk for COVID-19 are in a state of rapid change based on information released by regulatory bodies including the CDC and federal and state organizations. These policies and algorithms were followed during the patient's care in the ED.  Final Clinical Impression(s) / ED Diagnoses Final diagnoses:  Atypical chest pain   COVID-19    Rx / DC Orders ED Discharge Orders     None        Letizia Hook, Jeannett Senior, MD 09/22/20 1324    Glynn Octave, MD 09/22/20 0800

## 2020-09-22 NOTE — Progress Notes (Signed)
Contacted by EDP Dr. Manus Gunning for curbside discussion about Ms. Tracy Terrell; 45 year old female with past medical history of diet-controlled hypertension and heavy menstrual periods; who presented complaining of approximately 1 week of shortness of breath dry cough and spells and dyspnea on exertion.  Patient also reports some pleuritic chest discomfort.  Patient has been found to be mildly anemic with a hemoglobin of 8.7 (baseline for her around 10-11), no complaints of overt bleeding, melena, hematochezia or any other history of acute blood loss.  Anemia panel demonstrating a ferritin of 4 consistent with iron deficiency anemia most likely in the setting of heavy menstrual periods.  Patient was also found to be positive for COVID 19 infection; patient is vaccinated against COVID but not posted.  Her chest images (CT angiogram of the chest) demonstrated no acute cardiopulmonary process, no pulmonary embolism and she was found to be no hypoxic at rest or with exertion.  Troponins were negative x2 and there was no acute ischemic changes appreciated on EKG.  At this moment Internal medicine recommendations includes: Paxlovid twice a day for 5 days, 1 unit of PRBC's, electrolyte repletion (mild hypokalemia seen on blood-work), maintain adequate hydration, supportive care and continue practicing the 3W's. No hospital admission needed currently and patient is not qualifying base on our COVID protocol for any other intervention currently. Thanks for this interesting curbside discussion and reach out to [place] with any further questions, concerns or needs.  Vassie Loll MD [phone]

## 2020-09-22 NOTE — Discharge Instructions (Addendum)
Take viral medication as prescribed.  Follow-up with your doctor.  Keep yourself quarantine for total of 14 days from your first symptoms.  Your CT scan showed a nodule in your lung which needs follow-up in 1 year.  You also have a thyroid nodule for which your doctor can order an ultrasound. Return to the ED with difficulty breathing, chest pain, any other concerns

## 2020-09-23 LAB — TYPE AND SCREEN
ABO/RH(D): O POS
Antibody Screen: NEGATIVE
Unit division: 0

## 2020-09-23 LAB — BPAM RBC
Blood Product Expiration Date: 202208292359
ISSUE DATE / TIME: 202207230958
Unit Type and Rh: 5100

## 2020-11-08 ENCOUNTER — Other Ambulatory Visit: Payer: Self-pay

## 2020-11-08 ENCOUNTER — Encounter: Payer: Self-pay | Admitting: Nurse Practitioner

## 2020-11-08 ENCOUNTER — Ambulatory Visit (INDEPENDENT_AMBULATORY_CARE_PROVIDER_SITE_OTHER): Payer: BC Managed Care – PPO | Admitting: Nurse Practitioner

## 2020-11-08 VITALS — BP 138/72 | HR 96 | Temp 98.1°F | Ht 62.2 in | Wt 221.4 lb

## 2020-11-08 DIAGNOSIS — Z7689 Persons encountering health services in other specified circumstances: Secondary | ICD-10-CM | POA: Diagnosis not present

## 2020-11-08 DIAGNOSIS — E66813 Obesity, class 3: Secondary | ICD-10-CM

## 2020-11-08 DIAGNOSIS — E662 Morbid (severe) obesity with alveolar hypoventilation: Secondary | ICD-10-CM

## 2020-11-08 DIAGNOSIS — Z01419 Encounter for gynecological examination (general) (routine) without abnormal findings: Secondary | ICD-10-CM

## 2020-11-08 DIAGNOSIS — E876 Hypokalemia: Secondary | ICD-10-CM

## 2020-11-08 DIAGNOSIS — D649 Anemia, unspecified: Secondary | ICD-10-CM

## 2020-11-08 DIAGNOSIS — Z23 Encounter for immunization: Secondary | ICD-10-CM | POA: Diagnosis not present

## 2020-11-08 DIAGNOSIS — E041 Nontoxic single thyroid nodule: Secondary | ICD-10-CM

## 2020-11-08 DIAGNOSIS — I10 Essential (primary) hypertension: Secondary | ICD-10-CM | POA: Diagnosis not present

## 2020-11-08 DIAGNOSIS — Z9889 Other specified postprocedural states: Secondary | ICD-10-CM

## 2020-11-08 DIAGNOSIS — Z6841 Body Mass Index (BMI) 40.0 and over, adult: Secondary | ICD-10-CM

## 2020-11-08 DIAGNOSIS — Z8616 Personal history of COVID-19: Secondary | ICD-10-CM

## 2020-11-08 LAB — POCT URINALYSIS DIPSTICK
Bilirubin, UA: NEGATIVE
Blood, UA: NEGATIVE
Glucose, UA: NEGATIVE
Ketones, UA: NEGATIVE
Leukocytes, UA: NEGATIVE
Nitrite, UA: NEGATIVE
Protein, UA: NEGATIVE
Spec Grav, UA: 1.02 (ref 1.010–1.025)
Urobilinogen, UA: 0.2 E.U./dL
pH, UA: 7.5 (ref 5.0–8.0)

## 2020-11-08 MED ORDER — HYDROCHLOROTHIAZIDE 25 MG PO TABS: ORAL_TABLET | ORAL | 1 refills | Status: DC

## 2020-11-08 NOTE — Patient Instructions (Signed)

## 2020-11-08 NOTE — Progress Notes (Signed)
I,Yamilka Roman [employer] as a Education administrator for [employer], FNP.,have documented all relevant documentation on the behalf of Minette Brine, FNP,as directed by  Minette Brine, FNP while in the presence of Minette Brine, [place].   This visit occurred during the SARS-CoV-2 public health emergency.  Safety protocols were in place, including screening questions prior to the visit, additional usage of staff PPE, and extensive cleaning of exam room while observing appropriate contact time as indicated for disinfecting solutions.  Subjective:     Patient ID: Tracy Terrell , female    DOB: 07-15-1975 , 45 y.o.   MRN: [id number]   Chief Complaint  Patient presents with   Establish Care   Hypertension    HPI  Patient presents today to establish care. She has relocated back to [place] was in [place] and was being seen at a [employer].  She works as a Heritage manager.  Married for one year. She has one daughter who is 65 y/o. She has a history of bleeding heavily with her menses and was told she has fibroids. She was recommended to have a hysterectomy. She had chest pain in July and her Hgb 8.7 at the ER with chest pain in July and was given one unit PRBC. She has also had iron transfusions in the past.  She is taking an iron supplement  She would llike to be seen for her bp. She stated she recently went to the ER for chest pain and they did some tests on her and she was told she has a nodule on her thyroid and lung. She would like to get that taken care of.   Wt Readings from Last 3 Encounters: 11/08/20 : 221 lb 6.4 oz (100.4 kg) 09/21/20 : 217 lb (98.4 kg) 01/26/13 : 214 lb (97.1 kg)    Hypertension This is a chronic problem. The current episode started more than 1 year ago. The problem is controlled. Pertinent negatives include no chest pain, headaches, palpitations or shortness of breath. Risk factors for coronary artery disease include sedentary lifestyle. Past treatments include  diuretics.    Past Medical History:  Diagnosis Date   Anemia    Hypertension    controlled currently w/o meds     Family History  Problem Relation Age of Onset   Colon cancer Mother    Heart failure Father    Heart attack Maternal Aunt    Hypertension Maternal Aunt    Heart attack Maternal Uncle    Diabetes Maternal Grandmother    Hyperlipidemia Maternal Grandmother      Current Outpatient Medications:    hydrochlorothiazide (HYDRODIURIL) 25 MG tablet, hydrochlorothiazide 25 mg tablet, Disp: 90 tablet, Rfl: 1   Allergies  Allergen Reactions   Aspirin Hives   Dimenhydrinate Hives   Meperidine Hcl Hives   Demerol [Meperidine] Hives     Review of Systems  Constitutional: Negative.  Negative for activity change and fatigue.  Eyes:  Negative for visual disturbance.  Respiratory: Negative.  Negative for choking, shortness of breath and wheezing.   Cardiovascular: Negative.  Negative for chest pain, palpitations and leg swelling.  Gastrointestinal: Negative.   Endocrine: Negative.  Negative for polydipsia, polyphagia and polyuria.  Musculoskeletal: Negative.   Skin: Negative.   Neurological: Negative.  Negative for dizziness, weakness and headaches.  Psychiatric/Behavioral: Negative.  Negative for confusion. The patient is not nervous/anxious.     Today's Vitals   11/08/20 1127  BP: 138/72  Pulse: 96  Temp: 98.1 F (36.7 C)  SpO2: 99%  Weight: 221 lb 6.4 oz (100.4 kg)  Height: 5' 2.2" (1.58 m)  PainSc: 0-No pain   Body mass index is 40.23 kg/m.   Objective:  Physical Exam Vitals reviewed.  Constitutional:      General: She is not in acute distress.    Appearance: Normal appearance. She is well-developed. She is obese.  Eyes:     Pupils: Pupils are equal, round, and reactive to light.  Cardiovascular:     Rate and Rhythm: Normal rate and regular rhythm.     Pulses: Normal pulses.     Heart sounds: Normal heart sounds. No murmur heard. Pulmonary:      Effort: Pulmonary effort is normal. No respiratory distress.     Breath sounds: Normal breath sounds. No wheezing.  Musculoskeletal:        General: Normal range of motion.     Cervical back: Normal range of motion and neck supple. No tenderness.  Skin:    General: Skin is warm and dry.     Capillary Refill: Capillary refill takes less than 2 seconds.  Neurological:     General: No focal deficit present.     Mental Status: She is alert and oriented to person, place, and time.     Cranial Nerves: No cranial nerve deficit.     Motor: No weakness.  Psychiatric:        Mood and Affect: Mood normal.        Behavior: Behavior normal.        Thought Content: Thought content normal.        Judgment: Judgment normal.        Assessment And Plan:     1. Hypertension, unspecified type Comments: Blood pressure is fairly controlled continue current medications - hydrochlorothiazide (HYDRODIURIL) 25 MG tablet; hydrochlorothiazide 25 mg tablet  Dispense: 90 tablet; Refill: 1 - POCT Urinalysis Dipstick (81002) - Microalbumin / Creatinine Urine Ratio  2. Immunization due Influenza vaccine administered Encouraged to take Tylenol as needed for fever or muscle aches - Flu Vaccine QUAD 6+ mos PF IM (Fluarix Quad PF)  3. Thyroid nodule greater than or equal to 1 cm in diameter incidentally noted on imaging study Comments: Incidental finding on imaging study - US THYROID; Future  4. 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 Anemia, unspecified type Comments: Hgb was down to 8.5 and was administered PRBC will recheck CBC today - CBC  7. History of bilateral breast reduction surgery  8. History of COVID-19  9. Encounter for gynecological examination - Ambulatory  referral to Obstetrics / Gynecology  10. Establishing care with new doctor, encounter for    Patient was given opportunity to ask questions. Patient verbalized understanding of the plan and was able to repeat key elements of the plan. All questions were answered to their satisfaction.  Minette Brine, FNP   I, Minette Brine, FNP, have reviewed all documentation for this visit. The documentation on 11/21/20 for the exam, diagnosis, procedures, and orders are all accurate and complete.   IF YOU HAVE BEEN REFERRED TO A SPECIALIST, IT MAY TAKE 1-2 WEEKS TO SCHEDULE/PROCESS THE REFERRAL. IF YOU HAVE NOT HEARD FROM US/SPECIALIST IN TWO WEEKS, PLEASE GIVE [place] A CALL AT [phone] X [phone].   THE PATIENT IS ENCOURAGED TO PRACTICE SOCIAL  DISTANCING DUE TO THE COVID-19 PANDEMIC.

## 2020-11-09 LAB — BMP8+EGFR
BUN/Creatinine Ratio: 13 (ref 9–23)
BUN: 12 mg/dL (ref 6–24)
CO2: 25 mmol/L (ref 20–29)
Calcium: 9.1 mg/dL (ref 8.7–10.2)
Chloride: 100 mmol/L (ref 96–106)
Creatinine, Ser: 0.91 mg/dL (ref 0.57–1.00)
Glucose: 74 mg/dL (ref 65–99)
Potassium: 4.5 mmol/L (ref 3.5–5.2)
Sodium: 138 mmol/L (ref 134–144)
eGFR: 79 mL/min/{1.73_m2} (ref >59)

## 2020-11-09 LAB — MICROALBUMIN / CREATININE URINE RATIO
Creatinine, Urine: 49.5 mg/dL
Microalb/Creat Ratio: 15 mg/g creat (ref 0–29)
Microalbumin, Urine: 7.4 ug/mL

## 2020-11-09 LAB — CBC
Hematocrit: 29.7 % — ABNORMAL LOW (ref 34.0–46.6)
Hemoglobin: 8.9 g/dL — ABNORMAL LOW (ref 11.1–15.9)
MCH: 21.8 pg — ABNORMAL LOW (ref 26.6–33.0)
MCHC: 30 g/dL — ABNORMAL LOW (ref 31.5–35.7)
MCV: 73 fL — ABNORMAL LOW (ref 79–97)
Platelets: 381 10*3/uL (ref 150–450)
RBC: 4.09 x10E6/uL (ref 3.77–5.28)
RDW: 18.6 % — ABNORMAL HIGH (ref 11.7–15.4)
WBC: 7.9 10*3/uL (ref 3.4–10.8)

## 2020-11-12 ENCOUNTER — Ambulatory Visit
Admission: RE | Admit: 2020-11-12 | Discharge: 2020-11-12 | Disposition: A | Discharge status: Home / Self Care | Payer: BC Managed Care – PPO | Source: Ambulatory Visit | Attending: Nurse Practitioner | Admitting: Nurse Practitioner

## 2020-11-12 DIAGNOSIS — E041 Nontoxic single thyroid nodule: Secondary | ICD-10-CM

## 2020-11-12 DIAGNOSIS — E010 Iodine-deficiency related diffuse (endemic) goiter: Secondary | ICD-10-CM | POA: Diagnosis not present

## 2020-11-12 DIAGNOSIS — E042 Nontoxic multinodular goiter: Secondary | ICD-10-CM | POA: Diagnosis not present

## 2020-11-16 LAB — SPECIMEN STATUS REPORT

## 2020-11-19 ENCOUNTER — Other Ambulatory Visit: Payer: Self-pay | Admitting: Nurse Practitioner

## 2020-11-19 DIAGNOSIS — E010 Iodine-deficiency related diffuse (endemic) goiter: Secondary | ICD-10-CM

## 2020-11-19 DIAGNOSIS — E041 Nontoxic single thyroid nodule: Secondary | ICD-10-CM

## 2020-11-19 NOTE — Progress Notes (Signed)
She will need to return for the added labs, they did not have any of her blood sample left

## 2020-11-21 ENCOUNTER — Other Ambulatory Visit: Payer: BC Managed Care – PPO

## 2020-11-21 ENCOUNTER — Other Ambulatory Visit: Payer: Self-pay | Admitting: Nurse Practitioner

## 2020-11-21 ENCOUNTER — Other Ambulatory Visit: Payer: Self-pay

## 2020-11-21 DIAGNOSIS — D649 Anemia, unspecified: Secondary | ICD-10-CM

## 2020-11-22 LAB — IRON,TIBC AND FERRITIN PANEL
Ferritin: 7 ng/mL — ABNORMAL LOW (ref 15–150)
Iron Saturation: 5 % — CL (ref 15–55)
Iron: 18 ug/dL — ABNORMAL LOW (ref 27–159)
Total Iron Binding Capacity: 353 ug/dL (ref 250–450)
UIBC: 335 ug/dL (ref 131–425)

## 2020-12-06 ENCOUNTER — Encounter: Payer: Self-pay | Admitting: Nurse Practitioner

## 2020-12-14 DIAGNOSIS — Z0001 Encounter for general adult medical examination with abnormal findings: Secondary | ICD-10-CM | POA: Diagnosis not present

## 2021-01-15 DIAGNOSIS — E041 Nontoxic single thyroid nodule: Secondary | ICD-10-CM | POA: Diagnosis not present

## 2021-01-18 ENCOUNTER — Other Ambulatory Visit: Payer: Self-pay | Admitting: Otolaryngology

## 2021-01-18 DIAGNOSIS — E041 Nontoxic single thyroid nodule: Secondary | ICD-10-CM

## 2021-02-05 ENCOUNTER — Ambulatory Visit
Admission: RE | Admit: 2021-02-05 | Discharge: 2021-02-05 | Disposition: A | Discharge status: Home / Self Care | Payer: BC Managed Care – PPO | Source: Ambulatory Visit | Attending: Otolaryngology | Admitting: Otolaryngology

## 2021-02-05 ENCOUNTER — Other Ambulatory Visit (HOSPITAL_COMMUNITY)
Admission: RE | Admit: 2021-02-05 | Discharge: 2021-02-05 | Disposition: A | Discharge status: Home / Self Care | Payer: BC Managed Care – PPO | Source: Ambulatory Visit | Attending: Physician Assistant | Admitting: Physician Assistant

## 2021-02-05 DIAGNOSIS — E041 Nontoxic single thyroid nodule: Secondary | ICD-10-CM | POA: Diagnosis not present

## 2021-02-06 LAB — CYTOLOGY - NON PAP

## 2021-03-07 ENCOUNTER — Other Ambulatory Visit: Payer: BC Managed Care – PPO

## 2021-03-08 DIAGNOSIS — Z6841 Body Mass Index (BMI) 40.0 and over, adult: Secondary | ICD-10-CM | POA: Diagnosis not present

## 2021-03-08 DIAGNOSIS — N920 Excessive and frequent menstruation with regular cycle: Secondary | ICD-10-CM | POA: Diagnosis not present

## 2021-03-08 DIAGNOSIS — Z1231 Encounter for screening mammogram for malignant neoplasm of breast: Secondary | ICD-10-CM | POA: Diagnosis not present

## 2021-03-08 DIAGNOSIS — Z124 Encounter for screening for malignant neoplasm of cervix: Secondary | ICD-10-CM | POA: Diagnosis not present

## 2021-03-08 DIAGNOSIS — D259 Leiomyoma of uterus, unspecified: Secondary | ICD-10-CM | POA: Diagnosis not present

## 2021-03-08 DIAGNOSIS — Z01419 Encounter for gynecological examination (general) (routine) without abnormal findings: Secondary | ICD-10-CM | POA: Diagnosis not present

## 2021-03-08 DIAGNOSIS — D649 Anemia, unspecified: Secondary | ICD-10-CM | POA: Diagnosis not present

## 2021-03-08 DIAGNOSIS — Z113 Encounter for screening for infections with a predominantly sexual mode of transmission: Secondary | ICD-10-CM | POA: Diagnosis not present

## 2021-03-08 LAB — HM MAMMOGRAPHY: HM Mammogram: NORMAL (ref 0–4)

## 2021-03-11 ENCOUNTER — Encounter: Payer: BC Managed Care – PPO | Admitting: Nurse Practitioner

## 2021-04-01 ENCOUNTER — Other Ambulatory Visit: Payer: Self-pay

## 2021-04-01 ENCOUNTER — Ambulatory Visit (INDEPENDENT_AMBULATORY_CARE_PROVIDER_SITE_OTHER): Payer: BC Managed Care – PPO | Admitting: Nurse Practitioner

## 2021-04-01 ENCOUNTER — Encounter: Payer: Self-pay | Admitting: Nurse Practitioner

## 2021-04-01 VITALS — BP 128/70 | HR 92 | Temp 98.5°F | Ht 62.2 in | Wt 220.0 lb

## 2021-04-01 DIAGNOSIS — Z6839 Body mass index (BMI) 39.0-39.9, adult: Secondary | ICD-10-CM

## 2021-04-01 DIAGNOSIS — I10 Essential (primary) hypertension: Secondary | ICD-10-CM | POA: Diagnosis not present

## 2021-04-01 DIAGNOSIS — N920 Excessive and frequent menstruation with regular cycle: Secondary | ICD-10-CM

## 2021-04-01 DIAGNOSIS — Z1159 Encounter for screening for other viral diseases: Secondary | ICD-10-CM

## 2021-04-01 DIAGNOSIS — D500 Iron deficiency anemia secondary to blood loss (chronic): Secondary | ICD-10-CM | POA: Diagnosis not present

## 2021-04-01 NOTE — Progress Notes (Signed)
I,Tianna Badgett,acting as a Neurosurgeon for [employer], FNP.,have documented all relevant documentation on the behalf of Arnette Felts, FNP,as directed by  Arnette Felts, FNP while in the presence of Arnette Felts, FNP.  This visit occurred during the SARS-CoV-2 public health emergency.  Safety protocols were in place, including screening questions prior to the visit, additional usage of staff PPE, and extensive cleaning of exam room while observing appropriate contact time as indicated for disinfecting solutions.  Subjective:     Patient ID: Tracy Terrell , female    DOB: 08/07/1975 , 46 y.o.   MRN: [id number]   Chief Complaint  Patient presents with   Hypertension    HPI  Patient is here for HTN follow up. She was given a medication from her GYN that is to help the bleeding with her menstrual cycle.      Hypertension This is a chronic problem. The current episode started more than 1 year ago. The problem is controlled. Pertinent negatives include no chest pain, headaches, palpitations or shortness of breath. Risk factors for coronary artery disease include sedentary lifestyle. Past treatments include diuretics.    Past Medical History:  Diagnosis Date   Anemia    Hypertension    controlled currently w/o meds     Family History  Problem Relation Age of Onset   Colon cancer Mother    Heart failure Father    Heart attack Maternal Aunt    Hypertension Maternal Aunt    Heart attack Maternal Uncle    Diabetes Maternal Grandmother    Hyperlipidemia Maternal Grandmother      Current Outpatient Medications:    hydrochlorothiazide (HYDRODIURIL) 25 MG tablet, hydrochlorothiazide 25 mg tablet, Disp: 90 tablet, Rfl: 1   Allergies  Allergen Reactions   Aspirin Hives   Dimenhydrinate Hives   Meperidine Hcl Hives   Demerol [Meperidine] Hives     Review of Systems  Constitutional:  Positive for fatigue (unsure if related to her working an increased amount of hours or her iron).   Respiratory: Negative.  Negative for shortness of breath.   Cardiovascular: Negative.  Negative for chest pain and palpitations.  Gastrointestinal: Negative.   Neurological: Negative.  Negative for headaches.  Psychiatric/Behavioral: Negative.      Today's Vitals   04/01/21 1214  BP: 128/70  Pulse: 92  Temp: 98.5 F (36.9 C)  TempSrc: Oral  Weight: 220 lb (99.8 kg)  Height: 5' 2.2" (1.58 m)   Body mass index is 39.98 kg/m.  Wt Readings from Last 3 Encounters:  04/01/21 220 lb (99.8 kg)  11/08/20 221 lb 6.4 oz (100.4 kg)  09/21/20 217 lb (98.4 kg)    Objective:  Physical Exam Vitals reviewed.  Constitutional:      General: She is not in acute distress.    Appearance: Normal appearance. She is well-developed. She is obese.  Eyes:     Pupils: Pupils are equal, round, and reactive to light.  Cardiovascular:     Rate and Rhythm: Normal rate and regular rhythm.     Pulses: Normal pulses.     Heart sounds: Normal heart sounds. No murmur heard. Pulmonary:     Effort: Pulmonary effort is normal. No respiratory distress.     Breath sounds: Normal breath sounds. No wheezing.  Musculoskeletal:     Cervical back: No tenderness.  Skin:    General: Skin is warm and dry.     Capillary Refill: Capillary refill takes less than 2 seconds.  Neurological:  General: No focal deficit present.     Mental Status: She is alert and oriented to person, place, and time.     Cranial Nerves: No cranial nerve deficit.     Motor: No weakness.  Psychiatric:        Mood and Affect: Mood normal.        Behavior: Behavior normal.        Thought Content: Thought content normal.        Judgment: Judgment normal.        Assessment And Plan:     1. Hypertension, unspecified type Comments: Blood pressure is normal, continue current medications  2. Iron deficiency anemia due to chronic blood loss Comments: This may be effected by her heavy menses. If levels not better will refer to  hematology, last iron infusion was in [place].  - Iron, TIBC and Ferritin Panel - CBC  3. Menorrhagia with regular cycle Comments: She is taking a medication to help with her bleeding given by her GYN, if does not improve they plan to start birth control  4. Class 2 severe obesity due to excess calories with serious comorbidity and body mass index (BMI) of 39.0 to 39.9 in adult [facility]) She is encouraged to strive for BMI less than 30 to decrease cardiac risk. Advised to aim for at least 150 minutes of exercise per week.   5. Encounter for hepatitis C screening test for low risk patient Will check Hepatitis C screening due to recent recommendations to screen all adults 18 years and older - Hepatitis C antibody  Will get copy of last PAP from GYN, medical release form completed  Patient was given opportunity to ask questions. Patient verbalized understanding of the plan and was able to repeat key elements of the plan. All questions were answered to their satisfaction.  Arnette Felts, FNP   I, Arnette Felts, FNP, have reviewed all documentation for this visit. The documentation on 04/01/21 for the exam, diagnosis, procedures, and orders are all accurate and complete.   IF YOU HAVE BEEN REFERRED TO A SPECIALIST, IT MAY TAKE 1-2 WEEKS TO SCHEDULE/PROCESS THE REFERRAL. IF YOU HAVE NOT HEARD FROM US/SPECIALIST IN TWO WEEKS, PLEASE GIVE [place] A CALL AT [phone].   THE PATIENT IS ENCOURAGED TO PRACTICE SOCIAL DISTANCING DUE TO THE COVID-19 PANDEMIC.

## 2021-04-01 NOTE — Patient Instructions (Signed)

## 2021-04-02 ENCOUNTER — Other Ambulatory Visit: Payer: Self-pay | Admitting: Nurse Practitioner

## 2021-04-02 DIAGNOSIS — D500 Iron deficiency anemia secondary to blood loss (chronic): Secondary | ICD-10-CM

## 2021-04-02 LAB — IRON,TIBC AND FERRITIN PANEL
Ferritin: 7 ng/mL — ABNORMAL LOW (ref 15–150)
Iron Saturation: 7 % — CL (ref 15–55)
Iron: 24 ug/dL — ABNORMAL LOW (ref 27–159)
Total Iron Binding Capacity: 350 ug/dL (ref 250–450)
UIBC: 326 ug/dL (ref 131–425)

## 2021-04-02 LAB — CBC
Hematocrit: 33.5 % — ABNORMAL LOW (ref 34.0–46.6)
Hemoglobin: 9.6 g/dL — ABNORMAL LOW (ref 11.1–15.9)
MCH: 20.8 pg — ABNORMAL LOW (ref 26.6–33.0)
MCHC: 28.7 g/dL — ABNORMAL LOW (ref 31.5–35.7)
MCV: 73 fL — ABNORMAL LOW (ref 79–97)
Platelets: 393 10*3/uL (ref 150–450)
RBC: 4.62 x10E6/uL (ref 3.77–5.28)
RDW: 18 % — ABNORMAL HIGH (ref 11.7–15.4)
WBC: 6.8 10*3/uL (ref 3.4–10.8)

## 2021-04-02 LAB — HEPATITIS C ANTIBODY: Hep C Virus Ab: <0.1 s/co ratio (ref 0.0–0.9)

## 2021-04-04 ENCOUNTER — Telehealth: Payer: Self-pay | Admitting: Internal Medicine

## 2021-04-04 NOTE — Telephone Encounter (Signed)
Scheduled appt per 1/31 referral. Spoke to pt who is aware of appt date and time. Pt is aware to arrive 15 mins prior to appt time.

## 2021-04-22 ENCOUNTER — Other Ambulatory Visit: Payer: Self-pay

## 2021-04-22 DIAGNOSIS — D500 Iron deficiency anemia secondary to blood loss (chronic): Secondary | ICD-10-CM

## 2021-04-23 ENCOUNTER — Telehealth: Payer: Self-pay | Admitting: Pharmacy Technician

## 2021-04-23 ENCOUNTER — Other Ambulatory Visit: Payer: Self-pay

## 2021-04-23 ENCOUNTER — Inpatient Hospital Stay: Payer: BC Managed Care – PPO | Attending: Internal Medicine | Admitting: Internal Medicine

## 2021-04-23 ENCOUNTER — Inpatient Hospital Stay: Payer: BC Managed Care – PPO

## 2021-04-23 VITALS — BP 169/102 | HR 97 | Temp 97.6°F | Resp 16 | Wt 226.7 lb

## 2021-04-23 DIAGNOSIS — D500 Iron deficiency anemia secondary to blood loss (chronic): Secondary | ICD-10-CM | POA: Diagnosis not present

## 2021-04-23 DIAGNOSIS — N920 Excessive and frequent menstruation with regular cycle: Secondary | ICD-10-CM | POA: Insufficient documentation

## 2021-04-23 DIAGNOSIS — D509 Iron deficiency anemia, unspecified: Secondary | ICD-10-CM | POA: Insufficient documentation

## 2021-04-23 LAB — CMP (CANCER CENTER ONLY)
ALT: 66 U/L — ABNORMAL HIGH (ref 0–44)
AST: 71 U/L — ABNORMAL HIGH (ref 15–41)
Albumin: 3.7 g/dL (ref 3.5–5.0)
Alkaline Phosphatase: 85 U/L (ref 38–126)
Anion gap: 6 (ref 5–15)
BUN: 11 mg/dL (ref 6–20)
CO2: 27 mmol/L (ref 22–32)
Calcium: 8.7 mg/dL — ABNORMAL LOW (ref 8.9–10.3)
Chloride: 104 mmol/L (ref 98–111)
Creatinine: 1 mg/dL (ref 0.44–1.00)
GFR, Estimated: >60 mL/min (ref >60)
Glucose, Bld: 100 mg/dL — ABNORMAL HIGH (ref 70–99)
Potassium: 3.9 mmol/L (ref 3.5–5.1)
Sodium: 137 mmol/L (ref 135–145)
Total Bilirubin: 0.2 mg/dL — ABNORMAL LOW (ref 0.3–1.2)
Total Protein: 7.8 g/dL (ref 6.5–8.1)

## 2021-04-23 LAB — CBC WITH DIFFERENTIAL (CANCER CENTER ONLY)
Abs Immature Granulocytes: 0.02 10*3/uL (ref 0.00–0.07)
Basophils Absolute: 0 10*3/uL (ref 0.0–0.1)
Basophils Relative: 1 %
Eosinophils Absolute: 0.1 10*3/uL (ref 0.0–0.5)
Eosinophils Relative: 2 %
HCT: 29.6 % — ABNORMAL LOW (ref 36.0–46.0)
Hemoglobin: 8.6 g/dL — ABNORMAL LOW (ref 12.0–15.0)
Immature Granulocytes: 0 %
Lymphocytes Relative: 33 %
Lymphs Abs: 2.5 10*3/uL (ref 0.7–4.0)
MCH: 20.3 pg — ABNORMAL LOW (ref 26.0–34.0)
MCHC: 29.1 g/dL — ABNORMAL LOW (ref 30.0–36.0)
MCV: 69.8 fL — ABNORMAL LOW (ref 80.0–100.0)
Monocytes Absolute: 0.5 10*3/uL (ref 0.1–1.0)
Monocytes Relative: 7 %
Neutro Abs: 4.3 10*3/uL (ref 1.7–7.7)
Neutrophils Relative %: 57 %
Platelet Count: 412 10*3/uL — ABNORMAL HIGH (ref 150–400)
RBC: 4.24 MIL/uL (ref 3.87–5.11)
RDW: 20.2 % — ABNORMAL HIGH (ref 11.5–15.5)
WBC Count: 7.4 10*3/uL (ref 4.0–10.5)
nRBC: 0 % (ref 0.0–0.2)

## 2021-04-23 MED ORDER — INTEGRA PLUS PO CAPS: ORAL_CAPSULE | ORAL | 0 refills | Status: DC

## 2021-04-23 NOTE — Telephone Encounter (Addendum)
Auth Submission: APPROVED Payer: [employer] Medication & CPT/J Code(s) submitted: Venofer (Iron Sucrose) J1756 Route of submission (phone, fax, portal): PHONE [phone] opt- [phone] Auth type: Buy/Bill Units/visits requested: 3 Reference number: [id number] AUTH# [id number] APPROVAL: 04/23/21 - 5/22/3

## 2021-04-23 NOTE — Progress Notes (Signed)
[place] [phone]   [phone]  CONSULT NOTE  REFERRING PHYSICIAN: Minette Brine, FNP  REASON FOR CONSULTATION:  46 years old African-American female with iron deficiency anemia  HPI Tracy Terrell is a 46 y.o. female with past medical history significant for hypertension and anemia for several years.  The patient mentions that her anemia has been there since she was 46 years old.  She has been taking oral iron tablet every now and then but not recently.  The last iron supplement was more than a year ago.  She continues to have heavy menstrual period that last around 5 days with 3 heavy days.  She received iron infusion in the past when she was in [facility] but last infusion was in October 2021. The patient was seen recently by her primary care provider and was found on repeat blood work to have persistent anemia with significant iron deficiency.  Her ferritin level was 7.  Iron was 24 and iron saturation 7%.  She was referred to me today for evaluation and recommendation regarding her condition. When seen today the patient is feeling fine except for occasional shortness of breath and chest pain.  She is also tired most of the time.  She has no previous issues with oral iron tablets but she does not remember taking it at regular basis.  Her last colonoscopy was in 2020 and every 5 years because of her family history of colon cancer in her mother at age 22.  The patient has no nausea, vomiting, diarrhea or constipation.  She has no headache or visual changes. Family history significant for mother with colon cancer at age 63.  Father had congestive heart failure. The patient is married and has 1 daughter.  She works as a Midwife at [employer].  She has no history for smoking, alcohol or drug abuse.  HPI  Past Medical History:  Diagnosis Date   Anemia    Hypertension    controlled currently w/o meds    Past Surgical History:   Procedure Laterality Date   BREAST SURGERY     DILATION AND EVACUATION N/A 01/26/2013   Procedure: DILATATION AND EVACUATION WITH OPERATIVE ULTRASOUND;  Surgeon: Floyce Stakes. Pamala Hurry, MD;  Location: [facility];  Service: Gynecology;  Laterality: N/A;   LAPAROSCOPIC TUBAL LIGATION Bilateral 01/26/2013   Procedure: LAPAROSCOPIC TUBAL LIGATION;  Surgeon: Claiborne Billings A. Pamala Hurry, MD;  Location: [facility];  Service: Gynecology;  Laterality: Bilateral;   TREATMENT FISTULA ANAL      Family History  Problem Relation Age of Onset   Colon cancer Mother    Heart failure Father    Heart attack Maternal Aunt    Hypertension Maternal Aunt    Heart attack Maternal Uncle    Diabetes Maternal Grandmother    Hyperlipidemia Maternal Grandmother     Social History Social History   Tobacco Use   Smoking status: Never    Passive exposure: Never   Smokeless tobacco: Never  Substance Use Topics   Alcohol use: No   Drug use: No    Allergies  Allergen Reactions   Aspirin Hives   Dimenhydrinate Hives   Meperidine Hcl Hives   Demerol [Meperidine] Hives    Current Outpatient Medications  Medication Sig Dispense Refill   hydrochlorothiazide (HYDRODIURIL) 25 MG tablet hydrochlorothiazide 25 mg tablet 90 tablet 1   No current facility-administered medications for this visit.    Review of Systems  Constitutional: positive for fatigue Eyes:  negative Ears, nose, mouth, throat, and face: negative Respiratory: positive for dyspnea on exertion Cardiovascular: negative Gastrointestinal: negative Genitourinary:negative Integument/breast: negative Hematologic/lymphatic: negative Musculoskeletal:negative Neurological: negative Behavioral/Psych: negative Endocrine: negative Allergic/Immunologic: negative  Physical Exam  [id number], healthy, no distress, well nourished, and well developed SKIN: skin color, texture, turgor are normal, no rashes or significant lesions HEAD: Normocephalic, No masses, lesions,  tenderness or abnormalities EYES: normal, PERRLA, Conjunctiva are pink and non-injected EARS: External ears normal, Canals clear OROPHARYNX:no exudate, no erythema, and lips, buccal mucosa, and tongue normal  NECK: supple, no adenopathy, no JVD LYMPH:  no palpable lymphadenopathy, no hepatosplenomegaly BREAST:not examined LUNGS: clear to auscultation , and palpation HEART: regular rate & rhythm, no murmurs, and no gallops ABDOMEN:abdomen soft, non-tender, obese, normal bowel sounds, and no masses or organomegaly BACK: Back symmetric, no curvature., No CVA tenderness EXTREMITIES:no joint deformities, effusion, or inflammation, no edema  NEURO: alert & oriented x 3 with fluent speech, no focal motor/sensory deficits  PERFORMANCE STATUS: ECOG 1  LABORATORY DATA: Lab Results  Component Value Date   WBC 7.4 04/23/2021   HGB 8.6 (L) 04/23/2021   HCT 29.6 (L) 04/23/2021   MCV 69.8 (L) 04/23/2021   PLT 412 (H) 04/23/2021      Chemistry      Component Value Date/Time   NA 137 04/23/2021 1124   NA 138 11/08/2020 1233   K 3.9 04/23/2021 1124   CL 104 04/23/2021 1124   CO2 27 04/23/2021 1124   BUN 11 04/23/2021 1124   BUN 12 11/08/2020 1233   CREATININE 1.00 04/23/2021 1124      Component Value Date/Time   CALCIUM 8.7 (L) 04/23/2021 1124   ALKPHOS 85 04/23/2021 1124   AST 71 (H) 04/23/2021 1124   ALT 66 (H) 04/23/2021 1124   BILITOT 0.2 (L) 04/23/2021 1124       RADIOGRAPHIC STUDIES: No results found.  ASSESSMENT: This is a very pleasant 46 years old African-American female with history of iron deficiency anemia that has been going on for several years secondary to menorrhagia.  The patient is not very compliant with taking oral iron tablet at regular basis.    PLAN: I had a lengthy discussion with the patient today about her current condition and treatment options. Repeat CBC today showed hemoglobin of 8.6 and hematocrit 29.6% with MCV of 69.8.  Platelets count was also  elevated as reactive condition for the iron deficiency. I recommended for the patient to receive iron infusion with Venofer 300 mg IV weekly for 3 weeks. I will also start the patient on oral iron tablet with Integra +1 capsule p.o. daily. I will see her back for follow-up visit in 2 months for evaluation with repeat CBC, iron study and ferritin. The patient was advised to call immediately if she has any other concerning symptoms in the interval. The patient voices understanding of current disease status and treatment options and is in agreement with the current care plan.  All questions were answered. The patient knows to call the clinic with any problems, questions or concerns. We can certainly see the patient much sooner if necessary.  Thank you so much for allowing me to participate in the care of Tracy Terrell. I will continue to follow up the patient with you and assist in her care.  The total time spent in the appointment was 60 minutes.  Disclaimer: This note was dictated with voice recognition software. Similar sounding words can inadvertently be transcribed and may not be corrected  upon review.   Eilleen Kempf April 23, 2021, 12:01 PM

## 2021-05-03 ENCOUNTER — Other Ambulatory Visit: Payer: Self-pay

## 2021-05-03 ENCOUNTER — Ambulatory Visit (INDEPENDENT_AMBULATORY_CARE_PROVIDER_SITE_OTHER): Payer: BC Managed Care – PPO

## 2021-05-03 VITALS — BP 138/76 | HR 76 | Temp 98.9°F | Resp 16 | Ht 60.0 in | Wt 223.8 lb

## 2021-05-03 DIAGNOSIS — D500 Iron deficiency anemia secondary to blood loss (chronic): Secondary | ICD-10-CM | POA: Diagnosis not present

## 2021-05-03 MED ORDER — DIPHENHYDRAMINE HCL 50 MG/ML IJ SOLN: 50.0000 mg | Freq: Once | INTRAMUSCULAR | Status: DC | PRN

## 2021-05-03 MED ORDER — SODIUM CHLORIDE 0.9 % IV SOLN: Freq: Once | INTRAVENOUS | Status: DC | PRN

## 2021-05-03 MED ORDER — EPINEPHRINE 0.3 MG/0.3ML IJ SOAJ: 0.3000 mg | Freq: Once | INTRAMUSCULAR | Status: DC | PRN

## 2021-05-03 MED ORDER — ALBUTEROL SULFATE HFA 108 (90 BASE) MCG/ACT IN AERS: 2.0000 | INHALATION_SPRAY | Freq: Once | RESPIRATORY_TRACT | Status: DC | PRN

## 2021-05-03 MED ORDER — FAMOTIDINE IN NACL 20-0.9 MG/50ML-% IV SOLN: 20.0000 mg | Freq: Once | INTRAVENOUS | Status: DC | PRN

## 2021-05-03 MED ORDER — SODIUM CHLORIDE 0.9 % IV SOLN
300.0000 mg | Freq: Once | INTRAVENOUS | Status: AC
  Administered 2021-05-03: 300 mg via INTRAVENOUS
  Filled 2021-05-03: qty 15

## 2021-05-03 MED ORDER — METHYLPREDNISOLONE SODIUM SUCC 125 MG IJ SOLR: 125.0000 mg | Freq: Once | INTRAMUSCULAR | Status: DC | PRN

## 2021-05-03 NOTE — Progress Notes (Signed)
Diagnosis: Iron Deficiency Anemia ? ?Provider:  Chilton Greathouse, MD ? ?Procedure: Infusion ? ?IV Type: Peripheral, IV Location: R Antecubital ? ?Venofer (Iron Sucrose), Dose: 300 mg ? ?Infusion Start Time: 1016 ? ?Infusion Stop Time: 12.04 ? ?Post Infusion IV Care: Observation period completed and Peripheral IV Discontinued ? ?Discharge: Condition: Good, Destination: Home . AVS provided to patient.  ? ?Performed by:  Nat Math, RN  ?  ?

## 2021-05-10 ENCOUNTER — Other Ambulatory Visit: Payer: Self-pay

## 2021-05-10 ENCOUNTER — Ambulatory Visit (INDEPENDENT_AMBULATORY_CARE_PROVIDER_SITE_OTHER): Payer: BC Managed Care – PPO

## 2021-05-10 VITALS — BP 121/80 | HR 79 | Temp 98.7°F | Resp 16 | Ht 60.0 in | Wt 224.0 lb

## 2021-05-10 DIAGNOSIS — D500 Iron deficiency anemia secondary to blood loss (chronic): Secondary | ICD-10-CM | POA: Diagnosis not present

## 2021-05-10 MED ORDER — SODIUM CHLORIDE 0.9 % IV SOLN: Freq: Once | INTRAVENOUS | Status: DC | PRN

## 2021-05-10 MED ORDER — ALBUTEROL SULFATE HFA 108 (90 BASE) MCG/ACT IN AERS: 2.0000 | INHALATION_SPRAY | Freq: Once | RESPIRATORY_TRACT | Status: DC | PRN

## 2021-05-10 MED ORDER — SODIUM CHLORIDE 0.9 % IV SOLN
300.0000 mg | Freq: Once | INTRAVENOUS | Status: AC
  Administered 2021-05-10: 300 mg via INTRAVENOUS
  Filled 2021-05-10: qty 15

## 2021-05-10 MED ORDER — FAMOTIDINE IN NACL 20-0.9 MG/50ML-% IV SOLN: 20.0000 mg | Freq: Once | INTRAVENOUS | Status: DC | PRN

## 2021-05-10 MED ORDER — METHYLPREDNISOLONE SODIUM SUCC 125 MG IJ SOLR: 125.0000 mg | Freq: Once | INTRAMUSCULAR | Status: DC | PRN

## 2021-05-10 MED ORDER — DIPHENHYDRAMINE HCL 50 MG/ML IJ SOLN: 50.0000 mg | Freq: Once | INTRAMUSCULAR | Status: DC | PRN

## 2021-05-10 MED ORDER — EPINEPHRINE 0.3 MG/0.3ML IJ SOAJ: 0.3000 mg | Freq: Once | INTRAMUSCULAR | Status: DC | PRN

## 2021-05-10 NOTE — Progress Notes (Signed)
Diagnosis: Iron Deficiency Anemia ? ?Provider:  Chilton Greathouse, MD ? ?Procedure: Infusion ? ?IV Type: Peripheral, IV Location: R Antecubital ? ?Venofer (Iron Sucrose), Dose: 300 mg ? ?Infusion Start Time: 0908 ? ?Infusion Stop Time: 1048 ? ?Post Infusion IV Care: Peripheral IV Discontinued ? ?Discharge: Condition: Good, Destination: Home . AVS declined. ? ?Performed by:  Nat Math, RN  ?  ?

## 2021-05-17 ENCOUNTER — Ambulatory Visit (INDEPENDENT_AMBULATORY_CARE_PROVIDER_SITE_OTHER): Payer: BC Managed Care – PPO

## 2021-05-17 ENCOUNTER — Other Ambulatory Visit: Payer: Self-pay

## 2021-05-17 VITALS — BP 112/70 | HR 77 | Temp 98.2°F | Resp 24 | Ht 60.0 in | Wt 221.0 lb

## 2021-05-17 DIAGNOSIS — D500 Iron deficiency anemia secondary to blood loss (chronic): Secondary | ICD-10-CM

## 2021-05-17 MED ORDER — SODIUM CHLORIDE 0.9 % IV SOLN
300.0000 mg | Freq: Once | INTRAVENOUS | Status: AC
  Administered 2021-05-17: 300 mg via INTRAVENOUS
  Filled 2021-05-17: qty 15

## 2021-05-17 NOTE — Progress Notes (Signed)
Diagnosis: Iron Deficiency Anemia ? ?Provider:  Chilton Greathouse, MD ? ?Procedure: Infusion ? ?IV Type: Peripheral, IV Location: R Antecubital ? ?Venofer (Iron Sucrose), Dose: 300 mg ? ?Infusion Start Time: [phone] ? ?Infusion Stop Time: 1137 ? ?Post Infusion IV Care: Peripheral IV Discontinued ? ?Discharge: Condition: Good, Destination: Home . AVS declined. ? ?Performed by:  Nat Math, RN  ?  ?

## 2021-05-21 DIAGNOSIS — N920 Excessive and frequent menstruation with regular cycle: Secondary | ICD-10-CM | POA: Diagnosis not present

## 2021-05-21 DIAGNOSIS — D259 Leiomyoma of uterus, unspecified: Secondary | ICD-10-CM | POA: Diagnosis not present

## 2021-05-24 DIAGNOSIS — H66002 Acute suppurative otitis media without spontaneous rupture of ear drum, left ear: Secondary | ICD-10-CM | POA: Diagnosis not present

## 2021-06-13 ENCOUNTER — Encounter: Payer: Self-pay | Admitting: Nurse Practitioner

## 2021-06-13 ENCOUNTER — Ambulatory Visit (INDEPENDENT_AMBULATORY_CARE_PROVIDER_SITE_OTHER): Payer: BC Managed Care – PPO | Admitting: Nurse Practitioner

## 2021-06-13 VITALS — BP 128/70 | HR 87 | Temp 98.6°F | Ht 60.0 in | Wt 224.0 lb

## 2021-06-13 DIAGNOSIS — R202 Paresthesia of skin: Secondary | ICD-10-CM | POA: Diagnosis not present

## 2021-06-13 DIAGNOSIS — E662 Morbid (severe) obesity with alveolar hypoventilation: Secondary | ICD-10-CM | POA: Diagnosis not present

## 2021-06-13 DIAGNOSIS — Z6841 Body Mass Index (BMI) 40.0 and over, adult: Secondary | ICD-10-CM

## 2021-06-13 DIAGNOSIS — R200 Anesthesia of skin: Secondary | ICD-10-CM | POA: Diagnosis not present

## 2021-06-13 DIAGNOSIS — I10 Essential (primary) hypertension: Secondary | ICD-10-CM

## 2021-06-13 NOTE — Progress Notes (Signed)
?Industrial/product designer as a Education administrator for [employer], FNP.,have documented all relevant documentation on the behalf of Minette Brine, FNP,as directed by  Minette Brine, FNP while in the presence of Minette Brine, [place]. ? ?This visit occurred during the SARS-CoV-2 public health emergency.  Safety protocols were in place, including screening questions prior to the visit, additional usage of staff PPE, and extensive cleaning of exam room while observing appropriate contact time as indicated for disinfecting solutions. ? ?Subjective:  ?  ? Patient ID: Tracy Terrell , female    DOB: 1975/07/10 , 46 y.o.   MRN: [id number] ? ? ?Chief Complaint  ?Patient presents with  ? Hypertension  ? ? ?HPI ? ?Patient is here for HTN follow up. Tolerating HCTZ, trying to stay well hydrated. She has had iron transfusion and has an appt with Hematologist next week. GYN has started her on birth control. She will have a hysterectomy at a later time her uterus is size of 2nd trimester pregnancy ? ?Left leg will have numbness to anterior thigh radiating down leg. ? ?Hypertension ?This is a chronic problem. The current episode started more than 1 year ago. The problem is controlled. Pertinent negatives include no chest pain, headaches, palpitations or shortness of breath. Risk factors for coronary artery disease include sedentary lifestyle. Past treatments include diuretics.   ? ?Past Medical History:  ?Diagnosis Date  ? Anemia   ? Hypertension   ? controlled currently w/o meds  ?  ? ?Family History  ?Problem Relation Age of Onset  ? Colon cancer Mother   ? Heart failure Father   ? Heart attack Maternal Aunt   ? Hypertension Maternal Aunt   ? Heart attack Maternal Uncle   ? Diabetes Maternal Grandmother   ? Hyperlipidemia Maternal Grandmother   ? ? ? ?Current Outpatient Medications:  ?  FeFum-FePoly-FA-B Cmp-C-Biot (INTEGRA PLUS) CAPS, 1 capsule p.o. daily., Disp: 90 capsule, Rfl: 0 ?  hydrochlorothiazide (HYDRODIURIL) 25 MG tablet,  hydrochlorothiazide 25 mg tablet, Disp: 90 tablet, Rfl: 1  ? ?Allergies  ?Allergen Reactions  ? Aspirin Hives  ? Dimenhydrinate Hives  ? Meperidine Hcl Hives  ? Demerol [Meperidine] Hives  ?  ? ?Review of Systems  ?Constitutional: Negative.   ?Respiratory: Negative.  Negative for shortness of breath.   ?Cardiovascular: Negative.  Negative for chest pain, palpitations and leg swelling.  ?Gastrointestinal: Negative.   ?Neurological:  Positive for numbness. Negative for headaches.  ?     Left foot   ?Psychiatric/Behavioral: Negative.     ? ?Today's Vitals  ? 06/13/21 1152  ?BP: 128/70  ?Pulse: 87  ?Temp: 98.6 ?F (37 ?C)  ?TempSrc: Oral  ?Weight: 224 lb (101.6 kg)  ?Height: 5' (1.524 m)  ? ?Body mass index is 43.75 kg/m?.  ?Wt Readings from Last 3 Encounters:  ?06/18/21 227 lb (103 kg)  ?06/13/21 224 lb (101.6 kg)  ?05/17/21 221 lb (100.2 kg)  ? ? ?Objective:  ?Physical Exam ?Vitals reviewed.  ?Constitutional:   ?   General: She is not in acute distress. ?   Appearance: Normal appearance. She is well-developed. She is obese.  ?Eyes:  ?   Pupils: Pupils are equal, round, and reactive to light.  ?Cardiovascular:  ?   Rate and Rhythm: Normal rate and regular rhythm.  ?   Pulses: Normal pulses.  ?   Heart sounds: Normal heart sounds. No murmur heard. ?Pulmonary:  ?   Effort: Pulmonary effort is normal. No respiratory distress.  ?   Breath  sounds: Normal breath sounds. No wheezing.  ?Musculoskeletal:  ?   Cervical back: No tenderness.  ?Skin: ?   General: Skin is warm and dry.  ?   Capillary Refill: Capillary refill takes less than 2 seconds.  ?Neurological:  ?   General: No focal deficit present.  ?   Mental Status: She is alert and oriented to person, place, and time.  ?   Cranial Nerves: No cranial nerve deficit.  ?   Motor: No weakness.  ?Psychiatric:     ?   Mood and Affect: Mood normal.     ?   Behavior: Behavior normal.     ?   Thought Content: Thought content normal.     ?   Judgment: Judgment normal.  ?  ? ?    ?Assessment And Plan:  ?   ?1. Hypertension, unspecified type ?Comments: Blood pressure is well controlled, continue current medications ?- BMP8+eGFR ? ?2. Numbness and tingling of left leg ?Comments: Will check for metabolic causes, also concerned about possible meralgia paresthetica ?- Vitamin B12 ?- TSH ? ?3. 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 She is encouraged to strive for BMI less than 30 to decrease cardiac risk. Advised to aim for at least 150 minutes of exercise per week. ? ? ?Patient was given opportunity to ask questions. Patient verbalized understanding of the plan and was able to repeat key elements of the plan. All questions were answered to their satisfaction.  ?Minette Brine, FNP  ? ?I, Minette Brine, FNP, have reviewed all documentation for this visit. The documentation on 06/13/21 for the exam, diagnosis, procedures, and orders are all accurate and complete.  ? ?IF YOU HAVE BEEN REFERRED TO A SPECIALIST, IT MAY TAKE 1-2 WEEKS TO SCHEDULE/PROCESS THE REFERRAL. IF YOU HAVE NOT HEARD FROM US/SPECIALIST IN TWO WEEKS, PLEASE GIVE [place] A CALL AT [phone] X [phone].  ? ?THE PATIENT IS ENCOURAGED TO PRACTICE SOCIAL DISTANCING DUE TO THE COVID-19 PANDEMIC.   ?

## 2021-06-13 NOTE — Patient Instructions (Addendum)
Hypertension, Adult ?High blood pressure (hypertension) is when the force of blood pumping through the arteries is too strong. The arteries are the blood vessels that carry blood from the heart throughout the body. Hypertension forces the heart to work harder to pump blood and may cause arteries to become narrow or stiff. Untreated or uncontrolled hypertension can cause a heart attack, heart failure, a stroke, kidney disease, and other problems. ?A blood pressure reading consists of a higher number over a lower number. Ideally, your blood pressure should be below 120/80. The first ("top") number is called the systolic pressure. It is a measure of the pressure in your arteries as your heart beats. The second ("bottom") number is called the diastolic pressure. It is a measure of the pressure in your arteries as the heart relaxes. ?What are the causes? ?The exact cause of this condition is not known. There are some conditions that result in or are related to high blood pressure. ?What increases the risk? ?Some risk factors for high blood pressure are under your control. The following factors may make you more likely to develop this condition: ?Smoking. ?Having type 2 diabetes mellitus, high cholesterol, or both. ?Not getting enough exercise or physical activity. ?Being overweight. ?Having too much fat, sugar, calories, or salt (sodium) in your diet. ?Drinking too much alcohol. ?Some risk factors for high blood pressure may be difficult or impossible to change. Some of these factors include: ?Having chronic kidney disease. ?Having a family history of high blood pressure. ?Age. Risk increases with age. ?Race. You may be at higher risk if you are African American. ?Gender. Men are at higher risk than women before age 57. After age 32, women are at higher risk than men. ?Having obstructive sleep apnea. ?Stress. ?What are the signs or symptoms? ?High blood pressure may not cause symptoms. Very high blood pressure  (hypertensive crisis) may cause: ?Headache. ?Anxiety. ?Shortness of breath. ?Nosebleed. ?Nausea and vomiting. ?Vision changes. ?Severe chest pain. ?Seizures. ?How is this diagnosed? ?This condition is diagnosed by measuring your blood pressure while you are seated, with your arm resting on a flat surface, your legs uncrossed, and your feet flat on the floor. The cuff of the blood pressure monitor will be placed directly against the skin of your upper arm at the level of your heart. It should be measured at least twice using the same arm. Certain conditions can cause a difference in blood pressure between your right and left arms. ?Certain factors can cause blood pressure readings to be lower or higher than normal for a short period of time: ?When your blood pressure is higher when you are in a health care provider's office than when you are at home, this is called white coat hypertension. Most people with this condition do not need medicines. ?When your blood pressure is higher at home than when you are in a health care provider's office, this is called masked hypertension. Most people with this condition may need medicines to control blood pressure. ?If you have a high blood pressure reading during one visit or you have normal blood pressure with other risk factors, you may be asked to: ?Return on a different day to have your blood pressure checked again. ?Monitor your blood pressure at home for 1 week or longer. ?If you are diagnosed with hypertension, you may have other blood or imaging tests to help your health care provider understand your overall risk for other conditions. ?How is this treated? ?This condition is treated by making  healthy lifestyle changes, such as eating healthy foods, exercising more, and reducing your alcohol intake. Your health care provider may prescribe medicine if lifestyle changes are not enough to get your blood pressure under control, and if: ?Your systolic blood pressure is above  130. ?Your diastolic blood pressure is above 80. ?Your personal target blood pressure may vary depending on your medical conditions, your age, and other factors. ?Follow these instructions at home: ?Eating and drinking ? ?Eat a diet that is high in fiber and potassium, and low in sodium, added sugar, and fat. An example eating plan is called the DASH (Dietary Approaches to Stop Hypertension) diet. To eat this way: ?Eat plenty of fresh fruits and vegetables. Try to fill one half of your plate at each meal with fruits and vegetables. ?Eat whole grains, such as whole-wheat pasta, brown rice, or whole-grain bread. Fill about one fourth of your plate with whole grains. ?Eat or drink low-fat dairy products, such as skim milk or low-fat yogurt. ?Avoid fatty cuts of meat, processed or cured meats, and poultry with skin. Fill about one fourth of your plate with lean proteins, such as fish, chicken without skin, beans, eggs, or tofu. ?Avoid pre-made and processed foods. These tend to be higher in sodium, added sugar, and fat. ?Reduce your daily sodium intake. Most people with hypertension should eat less than 1,500 mg of sodium a day. ?Do not drink alcohol if: ?Your health care provider tells you not to drink. ?You are pregnant, may be pregnant, or are planning to become pregnant. ?If you drink alcohol: ?Limit how much you use to: ?0-1 drink a day for women. ?0-2 drinks a day for men. ?Be aware of how much alcohol is in your drink. In the [place] one drink equals one 12 oz bottle of beer (355 mL), one 5 oz glass of wine (148 mL), or one 1? oz glass of hard liquor (44 mL). ?Lifestyle ? ?Work with your health care provider to maintain a healthy body weight or to lose weight. Ask what an ideal weight is for you. ?Get at least 30 minutes of exercise most days of the week. Activities may include walking, swimming, or biking. ?Include exercise to strengthen your muscles (resistance exercise), such as Pilates or lifting weights, as  part of your weekly exercise routine. Try to do these types of exercises for 30 minutes at least 3 days a week. ?Do not use any products that contain nicotine or tobacco, such as cigarettes, e-cigarettes, and chewing tobacco. If you need help quitting, ask your health care provider. ?Monitor your blood pressure at home as told by your health care provider. ?Keep all follow-up visits as told by your health care provider. This is important. ?Medicines ?Take over-the-counter and prescription medicines only as told by your health care provider. Follow directions carefully. Blood pressure medicines must be taken as prescribed. ?Do not skip doses of blood pressure medicine. Doing this puts you at risk for problems and can make the medicine less effective. ?Ask your health care provider about side effects or reactions to medicines that you should watch for. ?Contact a health care provider if you: ?Think you are having a reaction to a medicine you are taking. ?Have headaches that keep coming back (recurring). ?Feel dizzy. ?Have swelling in your ankles. ?Have trouble with your vision. ?Get help right away if you: ?Develop a severe headache or confusion. ?Have unusual weakness or numbness. ?Feel faint. ?Have severe pain in your chest or abdomen. ?Vomit repeatedly. ?Have trouble  breathing. ?Summary ?Hypertension is when the force of blood pumping through your arteries is too strong. If this condition is not controlled, it may put you at risk for serious complications. ?Your personal target blood pressure may vary depending on your medical conditions, your age, and other factors. For most people, a normal blood pressure is less than 120/80. ?Hypertension is treated with lifestyle changes, medicines, or a combination of both. Lifestyle changes include losing weight, eating a healthy, low-sodium diet, exercising more, and limiting alcohol. ?This information is not intended to replace advice given to you by your health care  provider. Make sure you discuss any questions you have with your health care provider. ?Document Revised: 10/28/2017 Document Reviewed: 10/28/2017 ?Elsevier Patient Education ? 2022 Elsevier Inc. ? ? ?[email or web address]://www.hopkins

## 2021-06-14 LAB — BMP8+EGFR
BUN/Creatinine Ratio: 16 (ref 9–23)
BUN: 15 mg/dL (ref 6–24)
CO2: 25 mmol/L (ref 20–29)
Calcium: 9.4 mg/dL (ref 8.7–10.2)
Chloride: 98 mmol/L (ref 96–106)
Creatinine, Ser: 0.94 mg/dL (ref 0.57–1.00)
Glucose: 98 mg/dL (ref 70–99)
Potassium: 3.8 mmol/L (ref 3.5–5.2)
Sodium: 141 mmol/L (ref 134–144)
eGFR: 76 mL/min/{1.73_m2} (ref >59)

## 2021-06-14 LAB — VITAMIN B12: Vitamin B-12: 721 pg/mL (ref 232–1245)

## 2021-06-14 LAB — TSH: TSH: 1.27 u[IU]/mL (ref 0.450–4.500)

## 2021-06-18 ENCOUNTER — Inpatient Hospital Stay: Payer: BC Managed Care – PPO

## 2021-06-18 ENCOUNTER — Other Ambulatory Visit: Payer: Self-pay

## 2021-06-18 ENCOUNTER — Inpatient Hospital Stay: Payer: BC Managed Care – PPO | Attending: Internal Medicine | Admitting: Internal Medicine

## 2021-06-18 VITALS — BP 138/92 | HR 106 | Temp 97.5°F | Resp 18 | Ht 60.0 in | Wt 227.0 lb

## 2021-06-18 DIAGNOSIS — N920 Excessive and frequent menstruation with regular cycle: Secondary | ICD-10-CM | POA: Diagnosis not present

## 2021-06-18 DIAGNOSIS — D500 Iron deficiency anemia secondary to blood loss (chronic): Secondary | ICD-10-CM

## 2021-06-18 DIAGNOSIS — I10 Essential (primary) hypertension: Secondary | ICD-10-CM | POA: Insufficient documentation

## 2021-06-18 LAB — IRON AND IRON BINDING CAPACITY (CC-WL,HP ONLY)
Iron: 81 ug/dL (ref 28–170)
Saturation Ratios: 27 % (ref 10.4–31.8)
TIBC: 301 ug/dL (ref 250–450)
UIBC: 220 ug/dL (ref 148–442)

## 2021-06-18 LAB — CBC WITH DIFFERENTIAL (CANCER CENTER ONLY)
Abs Immature Granulocytes: 0.02 10*3/uL (ref 0.00–0.07)
Basophils Absolute: 0.1 10*3/uL (ref 0.0–0.1)
Basophils Relative: 1 %
Eosinophils Absolute: 0.1 10*3/uL (ref 0.0–0.5)
Eosinophils Relative: 2 %
HCT: 38.8 % (ref 36.0–46.0)
Hemoglobin: 12.4 g/dL (ref 12.0–15.0)
Immature Granulocytes: 0 %
Lymphocytes Relative: 31 %
Lymphs Abs: 2.3 10*3/uL (ref 0.7–4.0)
MCH: 26.1 pg (ref 26.0–34.0)
MCHC: 32 g/dL (ref 30.0–36.0)
MCV: 81.7 fL (ref 80.0–100.0)
Monocytes Absolute: 0.5 10*3/uL (ref 0.1–1.0)
Monocytes Relative: 7 %
Neutro Abs: 4.3 10*3/uL (ref 1.7–7.7)
Neutrophils Relative %: 59 %
Platelet Count: 268 10*3/uL (ref 150–400)
RBC: 4.75 MIL/uL (ref 3.87–5.11)
RDW: 24.9 % — ABNORMAL HIGH (ref 11.5–15.5)
WBC Count: 7.3 10*3/uL (ref 4.0–10.5)
nRBC: 0 % (ref 0.0–0.2)

## 2021-06-18 LAB — FERRITIN: Ferritin: 73 ng/mL (ref 11–307)

## 2021-06-18 NOTE — Progress Notes (Signed)
?    [place] [place] ?Telephone:([phone]) [phone]   Fax:([phone]) [phone] ? ?OFFICE PROGRESS NOTE ? ?Minette Brine, [place] ?[address] ?[place] [address] ? ?DIAGNOSIS:  history of iron deficiency anemia that has been going on for several years secondary to menorrhagia.   ? ?PRIOR THERAPY: None. ? ?CURRENT THERAPY: Integra +1 capsule p.o. daily. ? ?INTERVAL HISTORY: ?Tracy Terrell 46 y.o. female returns to the clinic today for follow-up visit.  The patient is feeling fine today with no concerning complaints.  She has more energy since her last visit.  She has been tolerating her treatment with Integra +1 capsule p.o. daily fairly well.  She also started contraceptive treatment by her gynecologist to regulate her cycles.  She denied having any chest pain, shortness of breath, cough or hemoptysis.  She denied having any nausea, vomiting, diarrhea or constipation.  She has no headache or visual changes.  She is here today for evaluation and repeat blood work. ? ?MEDICAL HISTORY: ?Past Medical History:  ?Diagnosis Date  ? Anemia   ? Hypertension   ? controlled currently w/o meds  ? ? ?ALLERGIES:  is allergic to aspirin, dimenhydrinate, meperidine hcl, and demerol [meperidine]. ? ?MEDICATIONS:  ?Current Outpatient Medications  ?Medication Sig Dispense Refill  ? FeFum-FePoly-FA-B Cmp-C-Biot (INTEGRA PLUS) CAPS 1 capsule p.o. daily. 90 capsule 0  ? hydrochlorothiazide (HYDRODIURIL) 25 MG tablet hydrochlorothiazide 25 mg tablet 90 tablet 1  ? ?No current facility-administered medications for this visit.  ? ? ?SURGICAL HISTORY:  ?Past Surgical History:  ?Procedure Laterality Date  ? BREAST SURGERY    ? DILATION AND EVACUATION N/A 01/26/2013  ? Procedure: DILATATION AND EVACUATION WITH OPERATIVE ULTRASOUND;  Surgeon: Floyce Stakes. Pamala Hurry, MD;  Location: [facility];  Service: Gynecology;  Laterality: N/A;  ? LAPAROSCOPIC TUBAL LIGATION Bilateral 01/26/2013  ? Procedure: LAPAROSCOPIC TUBAL LIGATION;  Surgeon:  Floyce Stakes. Pamala Hurry, MD;  Location: [facility];  Service: Gynecology;  Laterality: Bilateral;  ? TREATMENT FISTULA ANAL    ? ? ?REVIEW OF SYSTEMS:  A comprehensive review of systems was negative.  ? ?PHYSICAL EXAMINATION: General appearance: alert, cooperative, and no distress ?Head: Normocephalic, without obvious abnormality, atraumatic ?Neck: no adenopathy, no JVD, supple, symmetrical, trachea midline, and thyroid not enlarged, symmetric, no tenderness/mass/nodules ?Lymph nodes: Cervical, supraclavicular, and axillary nodes normal. ?Resp: clear to auscultation bilaterally ?Back: symmetric, no curvature. ROM normal. No CVA tenderness. ?Cardio: regular rate and rhythm, S1, S2 normal, no murmur, click, rub or gallop ?GI: soft, non-tender; bowel sounds normal; no masses,  no organomegaly ?Extremities: extremities normal, atraumatic, no cyanosis or edema ? ?ECOG PERFORMANCE STATUS: 1 - Symptomatic but completely ambulatory ? ?Blood pressure (!) 138/92, pulse (!) 106, temperature (!) 97.5 ?F (36.4 ?C), temperature source Tympanic, resp. rate 18, height 5' (1.524 m), weight 227 lb (103 kg), SpO2 100 %. ? ?LABORATORY DATA: ?Lab Results  ?Component Value Date  ? WBC 7.3 06/18/2021  ? HGB 12.4 06/18/2021  ? HCT 38.8 06/18/2021  ? MCV 81.7 06/18/2021  ? PLT 268 06/18/2021  ? ? ?  Chemistry   ?   ?Component Value Date/Time  ? NA 141 06/13/2021 1220  ? K 3.8 06/13/2021 1220  ? CL 98 06/13/2021 1220  ? CO2 25 06/13/2021 1220  ? BUN 15 06/13/2021 1220  ? CREATININE 0.94 06/13/2021 1220  ? CREATININE 1.00 04/23/2021 1124  ?    ?Component Value Date/Time  ? CALCIUM 9.4 06/13/2021 1220  ? ALKPHOS 85 04/23/2021 1124  ?  AST 71 (H) 04/23/2021 1124  ? ALT 66 (H) 04/23/2021 1124  ? BILITOT 0.2 (L) 04/23/2021 1124  ?  ? ? ? ?RADIOGRAPHIC STUDIES: ?No results found. ? ?ASSESSMENT AND PLAN: This is a very pleasant 46 years old African-American female with iron deficiency anemia secondary to menorrhagia. ?The patient started treatment with  Integra +1 capsule p.o. daily 2 months ago. ?Repeat CBC today showed significant improvement in her hemoglobin and hematocrit.  Her hemoglobin is up to 12.4 and hematocrit 38.8% with normal white blood count and normal platelets count.  Iron study and ferritin are still pending. ?I recommended for the patient to continue on the oral iron tablet for now with routine follow-up visit by her primary care provider. ?I will see her on as-needed basis at this point. ?The patient was advised to call if she has any concerning symptoms in the interval. ?The patient voices understanding of current disease status and treatment options and is in agreement with the current care plan. ? ?All questions were answered. The patient knows to call the clinic with any problems, questions or concerns. We can certainly see the patient much sooner if necessary. ? ?The total time spent in the appointment was 20 minutes. ? ?Disclaimer: This note was dictated with voice recognition software. Similar sounding words can inadvertently be transcribed and may not be corrected upon review. ? ? ?  ?   ?

## 2021-08-09 ENCOUNTER — Other Ambulatory Visit: Payer: Self-pay | Admitting: Nurse Practitioner

## 2021-08-09 DIAGNOSIS — I10 Essential (primary) hypertension: Secondary | ICD-10-CM

## 2021-08-12 ENCOUNTER — Encounter: Payer: BC Managed Care – PPO | Admitting: Nurse Practitioner

## 2021-08-26 ENCOUNTER — Encounter: Payer: Self-pay | Admitting: Nurse Practitioner

## 2021-08-26 ENCOUNTER — Ambulatory Visit (INDEPENDENT_AMBULATORY_CARE_PROVIDER_SITE_OTHER): Payer: BC Managed Care – PPO | Admitting: Nurse Practitioner

## 2021-08-26 VITALS — BP 118/72 | HR 86 | Temp 98.2°F | Ht 60.0 in | Wt 227.0 lb

## 2021-08-26 DIAGNOSIS — I10 Essential (primary) hypertension: Secondary | ICD-10-CM | POA: Diagnosis not present

## 2021-08-26 DIAGNOSIS — Z0000 Encounter for general adult medical examination without abnormal findings: Secondary | ICD-10-CM

## 2021-08-26 DIAGNOSIS — E66813 Obesity, class 3: Secondary | ICD-10-CM

## 2021-08-26 DIAGNOSIS — Z6841 Body Mass Index (BMI) 40.0 and over, adult: Secondary | ICD-10-CM

## 2021-08-26 DIAGNOSIS — Z13228 Encounter for screening for other metabolic disorders: Secondary | ICD-10-CM

## 2021-08-26 DIAGNOSIS — D500 Iron deficiency anemia secondary to blood loss (chronic): Secondary | ICD-10-CM

## 2021-08-26 DIAGNOSIS — E662 Morbid (severe) obesity with alveolar hypoventilation: Secondary | ICD-10-CM | POA: Diagnosis not present

## 2021-08-26 LAB — POCT URINALYSIS DIPSTICK
Bilirubin, UA: NEGATIVE
Blood, UA: NEGATIVE
Glucose, UA: NEGATIVE
Ketones, UA: NEGATIVE
Leukocytes, UA: NEGATIVE
Nitrite, UA: NEGATIVE
Protein, UA: NEGATIVE
Spec Grav, UA: 1.025 (ref 1.010–1.025)
Urobilinogen, UA: 0.2 E.U./dL
pH, UA: 5 (ref 5.0–8.0)

## 2021-08-26 NOTE — Progress Notes (Signed)
I,Tianna Badgett,acting as a Education administrator for [employer], FNP.,have documented all relevant documentation on the behalf of Minette Brine, FNP,as directed by  Minette Brine, FNP while in the presence of Minette Brine, [place].  Subjective:     Patient ID: Tracy Terrell , female    DOB: Nov 19, 1975 , 46 y.o.   MRN: [id number]   Chief Complaint  Patient presents with   Annual Exam    HPI  Patient presents today for HM. She has seen Hematology since her last visit no changes. She is taking integra. She was told her uterus is too big, on birth control. She is going to get injections to shrink her uterus and plans to have hysterectomy.   Wt Readings from Last 3 Encounters: 08/26/21 : 227 lb (103 kg) 06/18/21 : 227 lb (103 kg) 06/13/21 : 224 lb (101.6 kg)       Past Medical History:  Diagnosis Date   Anemia    Hypertension    controlled currently w/o meds     Family History  Problem Relation Age of Onset   Colon cancer Mother    Heart failure Father    Heart attack Maternal Aunt    Hypertension Maternal Aunt    Heart attack Maternal Uncle    Diabetes Maternal Grandmother    Hyperlipidemia Maternal Grandmother      Current Outpatient Medications:    FeFum-FePoly-FA-B Cmp-C-Biot (INTEGRA PLUS) CAPS, 1 capsule p.o. daily., Disp: 90 capsule, Rfl: 0   hydrochlorothiazide (HYDRODIURIL) 25 MG tablet, Take 1 tablet by mouth once daily, Disp: 90 tablet, Rfl: 0   Allergies  Allergen Reactions   Aspirin Hives   Dimenhydrinate Hives   Meperidine Hcl Hives   Demerol [Meperidine] Hives      The patient states she uses tubal ligation for birth control.  Patient's last menstrual period was 08/22/2021.. Negative for Dysmenorrhea and Negative for Menorrhagia. Negative for: breast discharge, breast lump(s), breast pain and breast self exam. Associated symptoms include abnormal vaginal bleeding. Pertinent negatives include abnormal bleeding (hematology), anxiety, decreased libido, depression,  difficulty falling sleep, dyspareunia, history of infertility, nocturia, sexual dysfunction, sleep disturbances, urinary incontinence, urinary urgency, vaginal discharge and vaginal itching. Diet regular; admits to not eating healthy. The patient states her exercise level is none - minimal. 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   The patient's tobacco use is:  Social History   Tobacco Use  Smoking Status Never   Passive exposure: Never  Smokeless Tobacco Never   She has been exposed to passive smoke. The patient's alcohol use is:  Social History   Substance and Sexual Activity  Alcohol Use No  . Additional information: Last pap 01/19/2020, next one scheduled for 01/19/2023.    Review of Systems  Constitutional: Negative.   HENT: Negative.    Eyes: Negative.   Respiratory: Negative.    Cardiovascular: Negative.   Gastrointestinal: Negative.   Endocrine: Negative.   Genitourinary: Negative.   Musculoskeletal: Negative.   Skin: Negative.   Allergic/Immunologic: Negative.   Neurological: Negative.   Hematological: Negative.   Psychiatric/Behavioral: Negative.       Today's Vitals   08/26/21 1242  BP: 118/72  Pulse: 86  Temp: 98.2 F (36.8 C)  TempSrc: Oral  Weight: 227 lb (103 kg)  Height: 5' (1.524 m)   Body mass index is 44.33 kg/m.  Wt Readings from Last 3 Encounters:  08/26/21 227 lb (103 kg)  06/18/21 227 lb (103 kg)  06/13/21 224 lb (101.6 kg)    Objective:  Physical Exam Vitals reviewed.  Constitutional:      General: She is not in acute distress.    Appearance: Normal appearance. She is well-developed. She is obese.  HENT:     Head: Normocephalic and atraumatic.     Right Ear: Hearing, tympanic membrane, ear canal and external ear normal. There is no impacted cerumen.     Left Ear: Hearing, tympanic membrane, ear canal and external ear normal. There is no impacted cerumen.     Nose: Nose normal.     Mouth/Throat:      Mouth: Mucous membranes are moist.  Eyes:     General: Lids are normal.     Extraocular Movements: Extraocular movements intact.     Conjunctiva/sclera: Conjunctivae normal.     Pupils: Pupils are equal, round, and reactive to light.     Funduscopic exam:    Right eye: No papilledema.        Left eye: No papilledema.  Neck:     Thyroid: No thyroid mass.     Vascular: No carotid bruit.  Cardiovascular:     Rate and Rhythm: Normal rate and regular rhythm.     Pulses: Normal pulses.     Heart sounds: Normal heart sounds. No murmur heard. Pulmonary:     Effort: Pulmonary effort is normal. No respiratory distress.     Breath sounds: Normal breath sounds. No wheezing.  Chest:     Chest wall: No mass.  Breasts:    Tanner Score is 5.     Right: Normal. No mass or tenderness.     Left: Normal. No mass or tenderness.  Abdominal:     General: Abdomen is flat. Bowel sounds are normal. There is no distension.     Palpations: Abdomen is soft.     Tenderness: There is no abdominal tenderness.  Musculoskeletal:        General: No swelling. Normal range of motion.     Cervical back: Full passive range of motion without pain, normal range of motion and neck supple.     Right lower leg: No edema.     Left lower leg: No edema.  Lymphadenopathy:     Upper Body:     Right upper body: No supraclavicular, axillary or pectoral adenopathy.     Left upper body: No supraclavicular, axillary or pectoral adenopathy.  Skin:    General: Skin is warm and dry.     Capillary Refill: Capillary refill takes less than 2 seconds.  Neurological:     General: No focal deficit present.     Mental Status: She is alert and oriented to person, place, and time.     Cranial Nerves: No cranial nerve deficit.     Sensory: No sensory deficit.     Motor: No weakness.  Psychiatric:        Mood and Affect: Mood normal.        Behavior: Behavior normal.        Thought Content: Thought content normal.        Judgment:  Judgment normal.         Assessment And Plan:     1. Routine general medical examination at a health care facility Behavior modifications discussed and diet history reviewed.   Pt will continue to exercise regularly and modify diet with low GI, plant based foods and decrease intake of processed foods.  Recommend intake of daily multivitamin, Vitamin D, and  calcium.  Recommend mammogram and colonoscopy for preventive screenings, as well as recommend immunizations that include influenza, TDAP  2. Encounter for screening for metabolic disorder - Hemoglobin A1c - Lipid panel  3. 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 Essential hypertension, benign Comments: Blood pressure is well controlled, continue current medications. EKG done with HR 93 - Microalbumin / Creatinine Urine Ratio - POCT Urinalysis Dipstick (81002) - EKG 12-Lead - CMP14+EGFR  5. Iron deficiency anemia due to chronic blood loss Comments: She had a blood transfusion. She was also advised her uterus was enlarged. - CBC - Iron, TIBC and Ferritin Panel She is encouraged to strive for BMI less than 30 to decrease cardiac risk. Advised to aim for at least 150 minutes of exercise per week.     Patient was given opportunity to ask questions. Patient verbalized understanding of the plan and was able to repeat key elements of the plan. All questions were answered to their satisfaction.   Minette Brine, FNP   I, Minette Brine, FNP, have reviewed all documentation for this visit. The documentation on 09/29/21 for the exam, diagnosis, procedures, and orders are all accurate and complete.  THE PATIENT IS ENCOURAGED TO PRACTICE SOCIAL DISTANCING DUE TO THE COVID-19  PANDEMIC.

## 2021-08-27 LAB — CMP14+EGFR
ALT: 32 IU/L (ref 0–32)
AST: 38 IU/L (ref 0–40)
Albumin/Globulin Ratio: 1.1 — ABNORMAL LOW (ref 1.2–2.2)
Albumin: 4.1 g/dL (ref 3.8–4.8)
Alkaline Phosphatase: 94 IU/L (ref 44–121)
BUN/Creatinine Ratio: 14 (ref 9–23)
BUN: 13 mg/dL (ref 6–24)
Bilirubin Total: <0.2 mg/dL (ref 0.0–1.2)
CO2: 25 mmol/L (ref 20–29)
Calcium: 9.2 mg/dL (ref 8.7–10.2)
Chloride: 100 mmol/L (ref 96–106)
Creatinine, Ser: 0.95 mg/dL (ref 0.57–1.00)
Globulin, Total: 3.8 g/dL (ref 1.5–4.5)
Glucose: 92 mg/dL (ref 70–99)
Potassium: 3.2 mmol/L — ABNORMAL LOW (ref 3.5–5.2)
Sodium: 138 mmol/L (ref 134–144)
Total Protein: 7.9 g/dL (ref 6.0–8.5)
eGFR: 75 mL/min/{1.73_m2} (ref >59)

## 2021-08-27 LAB — HEMOGLOBIN A1C
Est. average glucose Bld gHb Est-mCnc: 111 mg/dL
Hgb A1c MFr Bld: 5.5 % (ref 4.8–5.6)

## 2021-08-27 LAB — CBC
Hematocrit: 35.2 % (ref 34.0–46.6)
Hemoglobin: 11.6 g/dL (ref 11.1–15.9)
MCH: 28.1 pg (ref 26.6–33.0)
MCHC: 33 g/dL (ref 31.5–35.7)
MCV: 85 fL (ref 79–97)
Platelets: 318 10*3/uL (ref 150–450)
RBC: 4.13 x10E6/uL (ref 3.77–5.28)
RDW: 13.6 % (ref 11.7–15.4)
WBC: 6 10*3/uL (ref 3.4–10.8)

## 2021-08-27 LAB — LIPID PANEL
Chol/HDL Ratio: 4.1 ratio (ref 0.0–4.4)
Cholesterol, Total: 173 mg/dL (ref 100–199)
HDL: 42 mg/dL (ref >39)
LDL Chol Calc (NIH): 100 mg/dL — ABNORMAL HIGH (ref 0–99)
Triglycerides: 176 mg/dL — ABNORMAL HIGH (ref 0–149)
VLDL Cholesterol Cal: 31 mg/dL (ref 5–40)

## 2021-08-27 LAB — IRON,TIBC AND FERRITIN PANEL
Ferritin: 55 ng/mL (ref 15–150)
Iron Saturation: 31 % (ref 15–55)
Iron: 89 ug/dL (ref 27–159)
Total Iron Binding Capacity: 288 ug/dL (ref 250–450)
UIBC: 199 ug/dL (ref 131–425)

## 2021-08-27 LAB — MICROALBUMIN / CREATININE URINE RATIO
Creatinine, Urine: 122.9 mg/dL
Microalb/Creat Ratio: <2 mg/g creat (ref 0–29)
Microalbumin, Urine: <3 ug/mL

## 2021-10-22 DIAGNOSIS — H65192 Other acute nonsuppurative otitis media, left ear: Secondary | ICD-10-CM | POA: Diagnosis not present

## 2021-10-22 DIAGNOSIS — H9202 Otalgia, left ear: Secondary | ICD-10-CM | POA: Diagnosis not present

## 2021-11-05 DIAGNOSIS — R509 Fever, unspecified: Secondary | ICD-10-CM | POA: Diagnosis not present

## 2021-11-05 DIAGNOSIS — U071 COVID-19: Secondary | ICD-10-CM | POA: Diagnosis not present

## 2022-01-01 ENCOUNTER — Encounter: Payer: Self-pay | Admitting: Nurse Practitioner

## 2022-01-01 ENCOUNTER — Ambulatory Visit (INDEPENDENT_AMBULATORY_CARE_PROVIDER_SITE_OTHER): Payer: BC Managed Care – PPO | Admitting: Nurse Practitioner

## 2022-01-01 VITALS — BP 128/76 | HR 81 | Temp 98.3°F | Ht 60.0 in | Wt 226.0 lb

## 2022-01-01 DIAGNOSIS — Z6841 Body Mass Index (BMI) 40.0 and over, adult: Secondary | ICD-10-CM | POA: Diagnosis not present

## 2022-01-01 DIAGNOSIS — D508 Other iron deficiency anemias: Secondary | ICD-10-CM | POA: Diagnosis not present

## 2022-01-01 DIAGNOSIS — I10 Essential (primary) hypertension: Secondary | ICD-10-CM

## 2022-01-01 DIAGNOSIS — Z23 Encounter for immunization: Secondary | ICD-10-CM

## 2022-01-01 DIAGNOSIS — E662 Morbid (severe) obesity with alveolar hypoventilation: Secondary | ICD-10-CM

## 2022-01-01 MED ORDER — FERROUS SULFATE 325 (65 FE) MG PO TBEC: 325.0000 mg | DELAYED_RELEASE_TABLET | Freq: Every day | ORAL | 1 refills | Status: DC

## 2022-01-01 MED ORDER — HYDROCHLOROTHIAZIDE 25 MG PO TABS: ORAL_TABLET | ORAL | 0 refills | Status: DC

## 2022-01-01 NOTE — Progress Notes (Signed)
I,Tianna Badgett,acting as a Education administrator for [employer], FNP.,have documented all relevant documentation on the behalf of Minette Brine, FNP,as directed by  Minette Brine, FNP while in the presence of Minette Brine, [place].  Subjective:     Patient ID: Tracy Terrell , female    DOB: Jul 09, 1975 , 46 y.o.   MRN: [id number]   Chief Complaint  Patient presents with   Hypertension    HPI  Patient is here for HTN follow up. No current concerns  Hypertension This is a chronic problem. The current episode started more than 1 year ago. The problem is controlled. Pertinent negatives include no chest pain, headaches, palpitations or shortness of breath. Risk factors for coronary artery disease include sedentary lifestyle. Past treatments include diuretics.     Past Medical History:  Diagnosis Date   Anemia    Hypertension    controlled currently w/o meds     Family History  Problem Relation Age of Onset   Colon cancer Mother    Heart failure Father    Heart attack Maternal Aunt    Hypertension Maternal Aunt    Heart attack Maternal Uncle    Diabetes Maternal Grandmother    Hyperlipidemia Maternal Grandmother      Current Outpatient Medications:    ferrous sulfate 325 (65 FE) MG EC tablet, Take 1 tablet (325 mg total) by mouth daily with breakfast., Disp: 90 tablet, Rfl: 1   hydrochlorothiazide (HYDRODIURIL) 25 MG tablet, Take 1 tablet by mouth once daily, Disp: 90 tablet, Rfl: 0   Allergies  Allergen Reactions   Aspirin Hives   Dimenhydrinate Hives   Meperidine Hcl Hives   Demerol [Meperidine] Hives     Review of Systems  Constitutional: Negative.   Respiratory: Negative.  Negative for shortness of breath.   Cardiovascular: Negative.  Negative for chest pain and palpitations.  Gastrointestinal: Negative.   Neurological: Negative.  Negative for headaches.     Today's Vitals   01/01/22 0848  BP: 128/76  Pulse: 81  Temp: 98.3 F (36.8 C)  TempSrc: Oral  Weight: 226 lb  (102.5 kg)  Height: 5' (1.524 m)   Body mass index is 44.14 kg/m.  Wt Readings from Last 3 Encounters:  01/01/22 226 lb (102.5 kg)  08/26/21 227 lb (103 kg)  06/18/21 227 lb (103 kg)    Objective:  Physical Exam Vitals reviewed.  Constitutional:      General: She is not in acute distress.    Appearance: Normal appearance. She is well-developed. She is obese.  Eyes:     Pupils: Pupils are equal, round, and reactive to light.  Cardiovascular:     Rate and Rhythm: Normal rate and regular rhythm.     Pulses: Normal pulses.     Heart sounds: Normal heart sounds. No murmur heard. Pulmonary:     Effort: Pulmonary effort is normal. No respiratory distress.     Breath sounds: Normal breath sounds. No wheezing.  Musculoskeletal:     Cervical back: No tenderness.  Skin:    General: Skin is warm and dry.     Capillary Refill: Capillary refill takes less than 2 seconds.  Neurological:     General: No focal deficit present.     Mental Status: She is alert and oriented to person, place, and time.     Cranial Nerves: No cranial nerve deficit.     Motor: No weakness.  Psychiatric:        Mood and Affect: Mood normal.  Behavior: Behavior normal.        Thought Content: Thought content normal.        Judgment: Judgment normal.         Assessment And Plan:     1. Essential hypertension, benign Comments: Blood pressure is well controlled, continue current medications.  - Lipid panel - BMP8+EGFR - hydrochlorothiazide (HYDRODIURIL) 25 MG tablet; Take 1 tablet by mouth once daily  Dispense: 90 tablet; Refill: 0  2. Iron deficiency anemia secondary to inadequate dietary iron intake Comments: Continue taking iron supplement, last iron levels checked were normal - ferrous sulfate 325 (65 FE) MG EC tablet; Take 1 tablet (325 mg total) by mouth daily with breakfast.  Dispense: 90 tablet; Refill: 1  3. 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 Encouraged to increase her physical activity. Advised to aim for at least 150 minutes of exercise per week.    4. Need for influenza vaccination Influenza vaccine administered Encouraged to take Tylenol as needed for fever or muscle aches. - Flu Vaccine QUAD 6+ mos PF IM (Fluarix Quad PF)   Patient was given opportunity to ask questions. Patient verbalized understanding of the plan and was able to repeat key elements of the plan. All questions were answered to their satisfaction.  Minette Brine, FNP   I, Minette Brine, FNP, have reviewed all documentation for this visit. The documentation on 01/01/22 for the exam, diagnosis, procedures, and orders are all accurate and complete.   IF YOU HAVE BEEN REFERRED TO A SPECIALIST, IT MAY TAKE 1-2 WEEKS TO SCHEDULE/PROCESS THE REFERRAL. IF YOU HAVE NOT HEARD FROM US/SPECIALIST IN TWO WEEKS, PLEASE GIVE [place] A CALL AT [phone].   THE PATIENT IS ENCOURAGED TO PRACTICE SOCIAL DISTANCING DUE TO THE COVID-19 PANDEMIC.

## 2022-01-01 NOTE — Patient Instructions (Addendum)
Hypertension, Adult High blood pressure (hypertension) is when the force of blood pumping through the arteries is too strong. The arteries are the blood vessels that carry blood from the heart throughout the body. Hypertension forces the heart to work harder to pump blood and may cause arteries to become narrow or stiff. Untreated or uncontrolled hypertension can lead to a heart attack, heart failure, a stroke, kidney disease, and other problems. A blood pressure reading consists of a higher number over a lower number. Ideally, your blood pressure should be below 120/80. The first ("top") number is called the systolic pressure. It is a measure of the pressure in your arteries as your heart beats. The second ("bottom") number is called the diastolic pressure. It is a measure of the pressure in your arteries as the heart relaxes. What are the causes? The exact cause of this condition is not known. There are some conditions that result in high blood pressure. What increases the risk? Certain factors may make you more likely to develop high blood pressure. Some of these risk factors are under your control, including: Smoking. Not getting enough exercise or physical activity. Being overweight. Having too much fat, sugar, calories, or salt (sodium) in your diet. Drinking too much alcohol. Other risk factors include: Having a personal history of heart disease, diabetes, high cholesterol, or kidney disease. Stress. Having a family history of high blood pressure and high cholesterol. Having obstructive sleep apnea. Age. The risk increases with age. What are the signs or symptoms? High blood pressure may not cause symptoms. Very high blood pressure (hypertensive crisis) may cause: Headache. Fast or irregular heartbeats (palpitations). Shortness of breath. Nosebleed. Nausea and vomiting. Vision changes. Severe chest pain, dizziness, and seizures. How is this diagnosed? This condition is diagnosed by  measuring your blood pressure while you are seated, with your arm resting on a flat surface, your legs uncrossed, and your feet flat on the floor. The cuff of the blood pressure monitor will be placed directly against the skin of your upper arm at the level of your heart. Blood pressure should be measured at least twice using the same arm. Certain conditions can cause a difference in blood pressure between your right and left arms. If you have a high blood pressure reading during one visit or you have normal blood pressure with other risk factors, you may be asked to: Return on a different day to have your blood pressure checked again. Monitor your blood pressure at home for 1 week or longer. If you are diagnosed with hypertension, you may have other blood or imaging tests to help your health care provider understand your overall risk for other conditions. How is this treated? This condition is treated by making healthy lifestyle changes, such as eating healthy foods, exercising more, and reducing your alcohol intake. You may be referred for counseling on a healthy diet and physical activity. Your health care provider may prescribe medicine if lifestyle changes are not enough to get your blood pressure under control and if: Your systolic blood pressure is above 130. Your diastolic blood pressure is above 80. Your personal target blood pressure may vary depending on your medical conditions, your age, and other factors. Follow these instructions at home: Eating and drinking  Eat a diet that is high in fiber and potassium, and low in sodium, added sugar, and fat. An example of this eating plan is called the DASH diet. DASH stands for Dietary Approaches to Stop Hypertension. To eat this way: Eat   plenty of fresh fruits and vegetables. Try to fill one half of your plate at each meal with fruits and vegetables. Eat whole grains, such as whole-wheat pasta, brown rice, or whole-grain bread. Fill about one  fourth of your plate with whole grains. Eat or drink low-fat dairy products, such as skim milk or low-fat yogurt. Avoid fatty cuts of meat, processed or cured meats, and poultry with skin. Fill about one fourth of your plate with lean proteins, such as fish, chicken without skin, beans, eggs, or tofu. Avoid pre-made and processed foods. These tend to be higher in sodium, added sugar, and fat. Reduce your daily sodium intake. Many people with hypertension should eat less than 1,500 mg of sodium a day. Do not drink alcohol if: Your health care provider tells you not to drink. You are pregnant, may be pregnant, or are planning to become pregnant. If you drink alcohol: Limit how much you have to: 0-1 drink a day for women. 0-2 drinks a day for men. Know how much alcohol is in your drink. In the [place] one drink equals one 12 oz bottle of beer (355 mL), one 5 oz glass of wine (148 mL), or one 1 oz glass of hard liquor (44 mL). Lifestyle  Work with your health care provider to maintain a healthy body weight or to lose weight. Ask what an ideal weight is for you. Get at least 30 minutes of exercise that causes your heart to beat faster (aerobic exercise) most days of the week. Activities may include walking, swimming, or biking. Include exercise to strengthen your muscles (resistance exercise), such as Pilates or lifting weights, as part of your weekly exercise routine. Try to do these types of exercises for 30 minutes at least 3 days a week. Do not use any products that contain nicotine or tobacco. These products include cigarettes, chewing tobacco, and vaping devices, such as e-cigarettes. If you need help quitting, ask your health care provider. Monitor your blood pressure at home as told by your health care provider. Keep all follow-up visits. This is important. Medicines Take over-the-counter and prescription medicines only as told by your health care provider. Follow directions carefully. Blood  pressure medicines must be taken as prescribed. Do not skip doses of blood pressure medicine. Doing this puts you at risk for problems and can make the medicine less effective. Ask your health care provider about side effects or reactions to medicines that you should watch for. Contact a health care provider if you: Think you are having a reaction to a medicine you are taking. Have headaches that keep coming back (recurring). Feel dizzy. Have swelling in your ankles. Have trouble with your vision. Get help right away if you: Develop a severe headache or confusion. Have unusual weakness or numbness. Feel faint. Have severe pain in your chest or abdomen. Vomit repeatedly. Have trouble breathing. These symptoms may be an emergency. Get help right away. Call 911. Do not wait to see if the symptoms will go away. Do not drive yourself to the hospital. Summary Hypertension is when the force of blood pumping through your arteries is too strong. If this condition is not controlled, it may put you at risk for serious complications. Your personal target blood pressure may vary depending on your medical conditions, your age, and other factors. For most people, a normal blood pressure is less than 120/80. Hypertension is treated with lifestyle changes, medicines, or a combination of both. Lifestyle changes include losing weight, eating a healthy,   low-sodium diet, exercising more, and limiting alcohol. This information is not intended to replace advice given to you by your health care provider. Make sure you discuss any questions you have with your health care provider. Document Revised: 12/25/2020 Document Reviewed: 12/25/2020 Elsevier Patient Education  2023 Elsevier Inc.   Influenza (Flu) Vaccine (Inactivated or Recombinant): What You Need to Know 1. Why get vaccinated? Influenza vaccine can prevent influenza (flu). Flu is a contagious disease that spreads around the [place] every year,  usually between October and May. Anyone can get the flu, but it is more dangerous for some people. Infants and young children, people 65 years and older, pregnant people, and people with certain health conditions or a weakened immune system are at greatest risk of flu complications. Pneumonia, bronchitis, sinus infections, and ear infections are examples of flu-related complications. If you have a medical condition, such as heart disease, cancer, or diabetes, flu can make it worse. Flu can cause fever and chills, sore throat, muscle aches, fatigue, cough, headache, and runny or stuffy nose. Some people may have vomiting and diarrhea, though this is more common in children than adults. In an average year, thousands of people in the [place] die from flu, and many more are hospitalized. Flu vaccine prevents millions of illnesses and flu-related visits to the doctor each year. 2. Influenza vaccines CDC recommends everyone 6 months and older get vaccinated every flu season. Children 6 months through 8 years of age may need 2 doses during a single flu season. Everyone else needs only 1 dose each flu season. It takes about 2 weeks for protection to develop after vaccination. There are many flu viruses, and they are always changing. Each year a new flu vaccine is made to protect against the influenza viruses believed to be likely to cause disease in the upcoming flu season. Even when the vaccine doesn't exactly match these viruses, it may still provide some protection. Influenza vaccine does not cause flu. Influenza vaccine may be given at the same time as other vaccines. 3. Talk with your health care provider Tell your vaccination provider if the person getting the vaccine: Has had an allergic reaction after a previous dose of influenza vaccine, or has any severe, life-threatening allergies Has ever had Guillain-Barr Syndrome (also called "GBS") In some cases, your health care provider may decide to  postpone influenza vaccination until a future visit. Influenza vaccine can be administered at any time during pregnancy. People who are or will be pregnant during influenza season should receive inactivated influenza vaccine. People with minor illnesses, such as a cold, may be vaccinated. People who are moderately or severely ill should usually wait until they recover before getting influenza vaccine. Your health care provider can give you more information. 4. Risks of a vaccine reaction Soreness, redness, and swelling where the shot is given, fever, muscle aches, and headache can happen after influenza vaccination. There may be a very small increased risk of Guillain-Barr Syndrome (GBS) after inactivated influenza vaccine (the flu shot). Young children who get the flu shot along with pneumococcal vaccine (PCV13) and/or DTaP vaccine at the same time might be slightly more likely to have a seizure caused by fever. Tell your health care provider if a child who is getting flu vaccine has ever had a seizure. People sometimes faint after medical procedures, including vaccination. Tell your provider if you feel dizzy or have vision changes or ringing in the ears. As with any medicine, there is a very remote   chance of a vaccine causing a severe allergic reaction, other serious injury, or death. 5. What if there is a serious problem? An allergic reaction could occur after the vaccinated person leaves the clinic. If you see signs of a severe allergic reaction (hives, swelling of the face and throat, difficulty breathing, a fast heartbeat, dizziness, or weakness), call 9-1-1 and get the person to the nearest hospital. For other signs that concern you, call your health care provider. Adverse reactions should be reported to the Vaccine Adverse Event Reporting System (VAERS). Your health care provider will usually file this report, or you can do it yourself. Visit the VAERS website at www.vaers.hhs.gov or call  1-800-822-7967. VAERS is only for reporting reactions, and VAERS staff members do not give medical advice. 6. The National Vaccine Injury Compensation Program The National Vaccine Injury Compensation Program (VICP) is a federal program that was created to compensate people who may have been injured by certain vaccines. Claims regarding alleged injury or death due to vaccination have a time limit for filing, which may be as short as two years. Visit the VICP website at www.hrsa.gov/vaccinecompensation or call 1-800-338-2382 to learn about the program and about filing a claim. 7. How can I learn more? Ask your health care provider. Call your local or state health department. Visit the website of the Food and Drug Administration (FDA) for vaccine package inserts and additional information at www.fda.gov/vaccines-blood-biologics/vaccines. Contact the Centers for Disease Control and Prevention (CDC): Call 1-800-232-4636 (1-800-CDC-INFO) or Visit CDC's website at www.cdc.gov/flu. Source: CDC Vaccine Information Statement Inactivated Influenza Vaccine (10/07/2019) This same material is available at www.cdc.gov for no charge. This information is not intended to replace advice given to you by your health care provider. Make sure you discuss any questions you have with your health care provider. Document Revised: 01/15/2021 Document Reviewed: 11/08/2020 Elsevier Patient Education  2023 Elsevier Inc.  

## 2022-01-02 LAB — LIPID PANEL
Chol/HDL Ratio: 4.2 ratio (ref 0.0–4.4)
Cholesterol, Total: 198 mg/dL (ref 100–199)
HDL: 47 mg/dL (ref >39)
LDL Chol Calc (NIH): 116 mg/dL — ABNORMAL HIGH (ref 0–99)
Triglycerides: 200 mg/dL — ABNORMAL HIGH (ref 0–149)
VLDL Cholesterol Cal: 35 mg/dL (ref 5–40)

## 2022-01-02 LAB — BMP8+EGFR
BUN/Creatinine Ratio: 8 — ABNORMAL LOW (ref 9–23)
BUN: 8 mg/dL (ref 6–24)
CO2: 25 mmol/L (ref 20–29)
Calcium: 9.8 mg/dL (ref 8.7–10.2)
Chloride: 97 mmol/L (ref 96–106)
Creatinine, Ser: 0.95 mg/dL (ref 0.57–1.00)
Glucose: 91 mg/dL (ref 70–99)
Potassium: 4.4 mmol/L (ref 3.5–5.2)
Sodium: 135 mmol/L (ref 134–144)
eGFR: 75 mL/min/{1.73_m2} (ref >59)

## 2022-01-13 DIAGNOSIS — Z13220 Encounter for screening for lipoid disorders: Secondary | ICD-10-CM | POA: Diagnosis not present

## 2022-01-13 DIAGNOSIS — Z131 Encounter for screening for diabetes mellitus: Secondary | ICD-10-CM | POA: Diagnosis not present

## 2022-01-13 DIAGNOSIS — Z6841 Body Mass Index (BMI) 40.0 and over, adult: Secondary | ICD-10-CM | POA: Diagnosis not present

## 2022-01-13 DIAGNOSIS — Z136 Encounter for screening for cardiovascular disorders: Secondary | ICD-10-CM | POA: Diagnosis not present

## 2022-05-07 ENCOUNTER — Encounter: Payer: Self-pay | Admitting: Nurse Practitioner

## 2022-05-07 ENCOUNTER — Ambulatory Visit (INDEPENDENT_AMBULATORY_CARE_PROVIDER_SITE_OTHER): Payer: BC Managed Care – PPO | Admitting: Nurse Practitioner

## 2022-05-07 VITALS — BP 132/88 | HR 69 | Temp 98.0°F | Ht 60.0 in | Wt 228.0 lb

## 2022-05-07 DIAGNOSIS — Z9189 Other specified personal risk factors, not elsewhere classified: Secondary | ICD-10-CM

## 2022-05-07 DIAGNOSIS — D508 Other iron deficiency anemias: Secondary | ICD-10-CM | POA: Diagnosis not present

## 2022-05-07 DIAGNOSIS — R911 Solitary pulmonary nodule: Secondary | ICD-10-CM | POA: Diagnosis not present

## 2022-05-07 DIAGNOSIS — E662 Morbid (severe) obesity with alveolar hypoventilation: Secondary | ICD-10-CM

## 2022-05-07 DIAGNOSIS — E7800 Pure hypercholesterolemia, unspecified: Secondary | ICD-10-CM

## 2022-05-07 DIAGNOSIS — Z6841 Body Mass Index (BMI) 40.0 and over, adult: Secondary | ICD-10-CM

## 2022-05-07 DIAGNOSIS — I10 Essential (primary) hypertension: Secondary | ICD-10-CM | POA: Diagnosis not present

## 2022-05-07 DIAGNOSIS — E041 Nontoxic single thyroid nodule: Secondary | ICD-10-CM

## 2022-05-07 MED ORDER — FOLIVANE-PLUS PO CAPS: 1.0000 | ORAL_CAPSULE | Freq: Every day | ORAL | 1 refills | Status: AC

## 2022-05-07 MED ORDER — HYDROCHLOROTHIAZIDE 25 MG PO TABS: ORAL_TABLET | ORAL | 1 refills | Status: AC

## 2022-05-07 NOTE — Progress Notes (Signed)
I,Sheena H Holbrook,acting as a Education administrator for Minette Brine, FNP.,have documented all relevant documentation on the behalf of Minette Brine, FNP,as directed by  Minette Brine, FNP while in the presence of Minette Brine, [place].    Subjective:     Patient ID: Tracy Terrell , female    DOB: 05/23/1975 , 47 y.o.   MRN: [id number]   Chief Complaint  Patient presents with   Hypertension    HPI  Patient presents today for hypertension follow up. She has missed some doses of her HCTZ but has been consistent the last 4 days. Patient would like to discuss repeating CT scan to assess lung nodule. She has no current respiratory symptoms.    Wt Readings from Last 3 Encounters: 05/07/22 : 228 lb (103.4 kg) 01/01/22 : 226 lb (102.5 kg) 08/26/21 : 227 lb (103 kg)    Hypertension This is a chronic problem. The current episode started more than 1 year ago. The problem is uncontrolled. Pertinent negatives include no anxiety, chest pain, headaches, palpitations or shortness of breath. Risk factors for coronary artery disease include obesity and sedentary lifestyle. Past treatments include diuretics. Compliance problems: Trying to do better.  There is no history of chronic renal disease.     Past Medical History:  Diagnosis Date   Anemia    Hypertension    controlled currently w/o meds     Family History  Problem Relation Age of Onset   Colon cancer Mother    Heart failure Father    Heart attack Maternal Aunt    Hypertension Maternal Aunt    Heart attack Maternal Uncle    Diabetes Maternal Grandmother    Hyperlipidemia Maternal Grandmother      Current Outpatient Medications:    FeFum-FePoly-FA-B Cmp-C-Biot (FOLIVANE-PLUS) CAPS, Take 1 capsule by mouth daily., Disp: 90 capsule, Rfl: 1   hydrochlorothiazide (HYDRODIURIL) 25 MG tablet, Take 1 tablet by mouth once daily, Disp: 90 tablet, Rfl: 1   Allergies  Allergen Reactions   Aspirin Hives   Dimenhydrinate Hives   Meperidine Hcl Hives    Demerol [Meperidine] Hives     Review of Systems  Constitutional: Negative.   Respiratory: Negative.  Negative for shortness of breath.   Cardiovascular: Negative.  Negative for chest pain and palpitations.  Gastrointestinal: Negative.   Neurological: Negative.  Negative for headaches.     Today's Vitals   05/07/22 0839 05/07/22 0910  BP: (!) 152/88 132/88  Pulse: 69   Temp: 98 F (36.7 C)   TempSrc: Oral   SpO2: 99%   Weight: 228 lb (103.4 kg)   Height: 5' (1.524 m)    Body mass index is 44.53 kg/m.   Objective:  Physical Exam Vitals reviewed.  Constitutional:      General: She is not in acute distress.    Appearance: Normal appearance. She is well-developed. She is obese.  Eyes:     Pupils: Pupils are equal, round, and reactive to light.  Cardiovascular:     Rate and Rhythm: Normal rate and regular rhythm.     Pulses: Normal pulses.     Heart sounds: Normal heart sounds. No murmur heard. Pulmonary:     Effort: Pulmonary effort is normal. No respiratory distress.     Breath sounds: Normal breath sounds. No wheezing.  Musculoskeletal:     Cervical back: No tenderness.  Skin:    General: Skin is warm and dry.     Capillary Refill: Capillary refill takes less than 2 seconds.  Neurological:     General: No focal deficit present.     Mental Status: She is alert and oriented to person, place, and time.     Cranial Nerves: No cranial nerve deficit.     Motor: No weakness.  Psychiatric:        Mood and Affect: Mood normal.        Behavior: Behavior normal.        Thought Content: Thought content normal.        Judgment: Judgment normal.         Assessment And Plan:     1. Essential hypertension, benign Comments: Blood pressure is well controlled, continue current medications. Encouraged to focus on diet low in salt and increase physical activity throughout day. - hydrochlorothiazide (HYDRODIURIL) 25 MG tablet; Take 1 tablet by mouth once daily  Dispense: 90  tablet; Refill: 1 - BMP8+eGFR  2. Thyroid nodule Comments: She has had a biopsy in 01/2021 which was negative, will repeat thyroid ultrasound - US THYROID; Future  3. Incidental lung nodule Comments: Had a 83m nodule seen on CT scan in 2022, was not considered high risk due to not a smoker however reports around 2nd hand smoke - CT Chest W Contrast; Future  4. Elevated cholesterol Comments: Diet controlled, continue with low fat diet - Lipid panel  5. Iron deficiency anemia secondary to inadequate dietary iron intake Comments: Will check levels, would like to restart integra unable to tolerate ferrous sulfate. - Iron, TIBC and Ferritin Panel - FeFum-FePoly-FA-B Cmp-C-Biot (FOLIVANE-PLUS) CAPS; Take 1 capsule by mouth daily.  Dispense: 90 capsule; Refill: 1  6. At risk from secondhand smoke exposure - CT Chest W Contrast; Future  7. 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 Advised to aim for at least 150 minutes of exercise per week.     Patient was given opportunity to ask questions. Patient verbalized understanding of the plan and was able to repeat key elements of the plan. All questions were answered to their satisfaction.  JMinette Brine FNP   I, JMinette Brine FNP, have reviewed all documentation for this visit. The documentation on 05/07/22 for the exam, diagnosis, procedures, and orders are all accurate and complete.   IF YOU HAVE BEEN REFERRED TO A SPECIALIST, IT MAY TAKE 1-2 WEEKS TO SCHEDULE/PROCESS THE REFERRAL. IF YOU HAVE NOT HEARD FROM US/SPECIALIST IN TWO WEEKS, PLEASE GIVE [place] CALL AT [phone].   THE PATIENT IS ENCOURAGED TO PRACTICE SOCIAL DISTANCING DUE TO THE COVID-19 PANDEMIC.

## 2022-05-08 LAB — BMP8+EGFR
BUN/Creatinine Ratio: 11 (ref 9–23)
BUN: 10 mg/dL (ref 6–24)
CO2: 20 mmol/L (ref 20–29)
Calcium: 8.6 mg/dL — ABNORMAL LOW (ref 8.7–10.2)
Chloride: 103 mmol/L (ref 96–106)
Creatinine, Ser: 0.91 mg/dL (ref 0.57–1.00)
Glucose: 89 mg/dL (ref 70–99)
Potassium: 4.4 mmol/L (ref 3.5–5.2)
Sodium: 139 mmol/L (ref 134–144)
eGFR: 79 mL/min/{1.73_m2} (ref >59)

## 2022-05-08 LAB — IRON,TIBC AND FERRITIN PANEL
Ferritin: 7 ng/mL — ABNORMAL LOW (ref 15–150)
Iron Saturation: 5 % — CL (ref 15–55)
Iron: 17 ug/dL — ABNORMAL LOW (ref 27–159)
Total Iron Binding Capacity: 326 ug/dL (ref 250–450)
UIBC: 309 ug/dL (ref 131–425)

## 2022-05-08 LAB — LIPID PANEL
Chol/HDL Ratio: 4.4 ratio (ref 0.0–4.4)
Cholesterol, Total: 193 mg/dL (ref 100–199)
HDL: 44 mg/dL (ref >39)
LDL Chol Calc (NIH): 123 mg/dL — ABNORMAL HIGH (ref 0–99)
Triglycerides: 145 mg/dL (ref 0–149)
VLDL Cholesterol Cal: 26 mg/dL (ref 5–40)

## 2022-05-12 ENCOUNTER — Encounter: Payer: Self-pay | Admitting: Nurse Practitioner

## 2022-06-03 ENCOUNTER — Encounter: Payer: Self-pay | Admitting: Nurse Practitioner

## 2022-06-09 ENCOUNTER — Ambulatory Visit
Admission: RE | Admit: 2022-06-09 | Discharge: 2022-06-09 | Disposition: A | Discharge status: Home / Self Care | Payer: BC Managed Care – PPO | Source: Ambulatory Visit | Attending: Nurse Practitioner | Admitting: Nurse Practitioner

## 2022-06-09 ENCOUNTER — Other Ambulatory Visit: Payer: BC Managed Care – PPO

## 2022-06-09 DIAGNOSIS — E041 Nontoxic single thyroid nodule: Secondary | ICD-10-CM | POA: Diagnosis not present

## 2022-07-03 ENCOUNTER — Ambulatory Visit
Admission: RE | Admit: 2022-07-03 | Discharge: 2022-07-03 | Disposition: A | Discharge status: Home / Self Care | Payer: BC Managed Care – PPO | Source: Ambulatory Visit | Attending: Nurse Practitioner | Admitting: Nurse Practitioner

## 2022-07-03 DIAGNOSIS — R911 Solitary pulmonary nodule: Secondary | ICD-10-CM

## 2022-07-03 DIAGNOSIS — Z9189 Other specified personal risk factors, not elsewhere classified: Secondary | ICD-10-CM

## 2022-07-03 MED ORDER — IOPAMIDOL (ISOVUE-300) INJECTION 61%
75.0000 mL | Freq: Once | INTRAVENOUS | Status: AC | PRN
  Administered 2022-07-03: 75 mL via INTRAVENOUS

## 2022-09-03 ENCOUNTER — Encounter: Payer: BC Managed Care – PPO | Admitting: Nurse Practitioner

## 2022-09-29 ENCOUNTER — Encounter: Payer: BC Managed Care – PPO | Admitting: Nurse Practitioner

## 2023-05-11 IMAGING — US US FNA BIOPSY THYROID 1ST LESION
1 series · 13 of 13 positions shown · non-contrast
Comparison: US thyroid 11/12/2020

INDICATION: Indeterminate thyroid nodule

EXAM:
ULTRASOUND GUIDED FINE NEEDLE ASPIRATION OF INDETERMINATE THYROID
NODULE
TECHNIQUE: Informed written consent was obtained from the patient after a
discussion of the risks, benefits and alternatives to treatment.
Questions regarding the procedure were encouraged and answered. A
timeout was performed prior to the initiation of the procedure.

[Series 1: us fna biopsy thyroid 1st lesion · 0.04mm/px · 13 acquisitions, 13 frames shown]
[im 1/13]
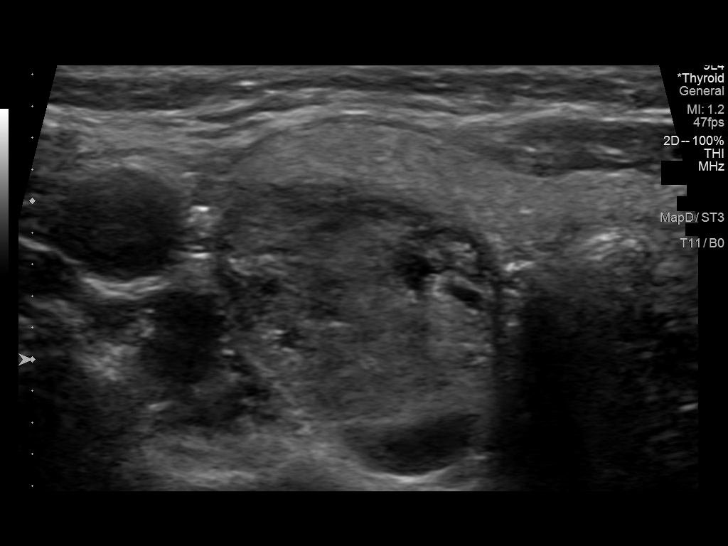
[im 2/13]
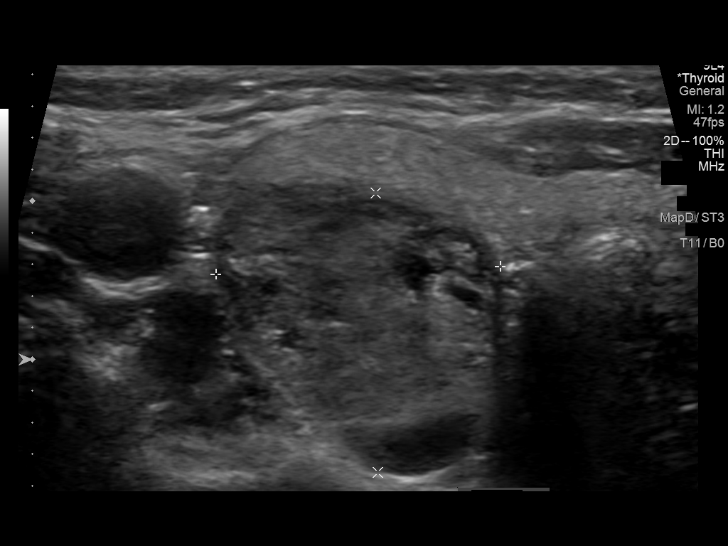
[im 3/13]
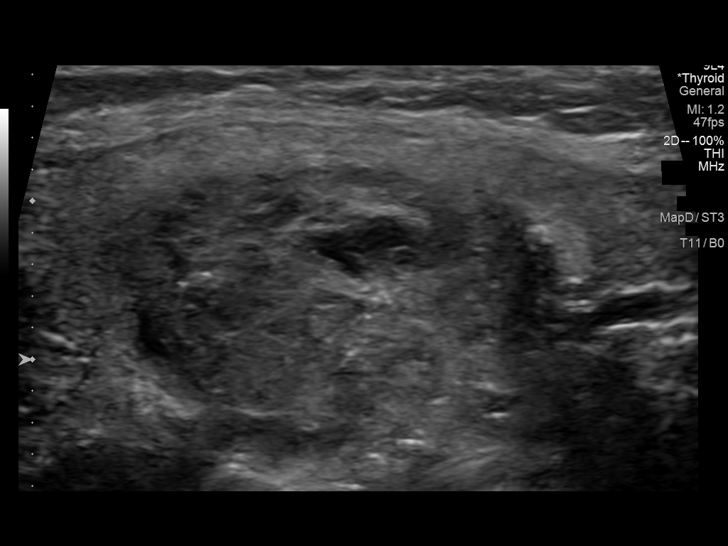
[im 4/13]
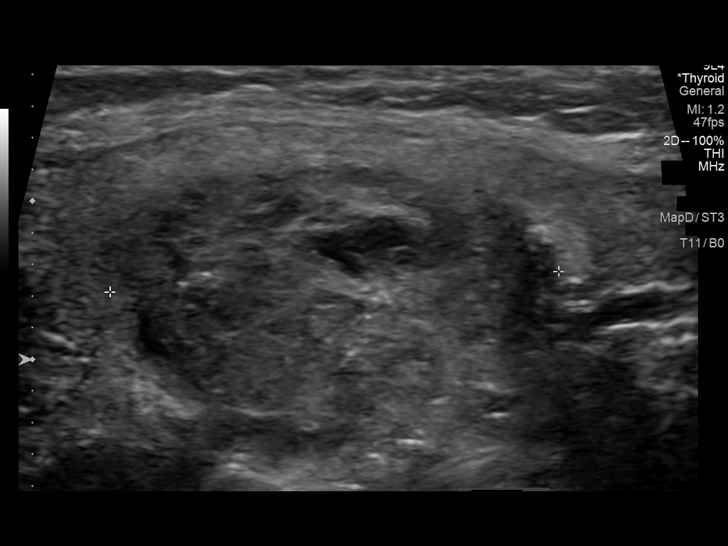
[im 5/13]
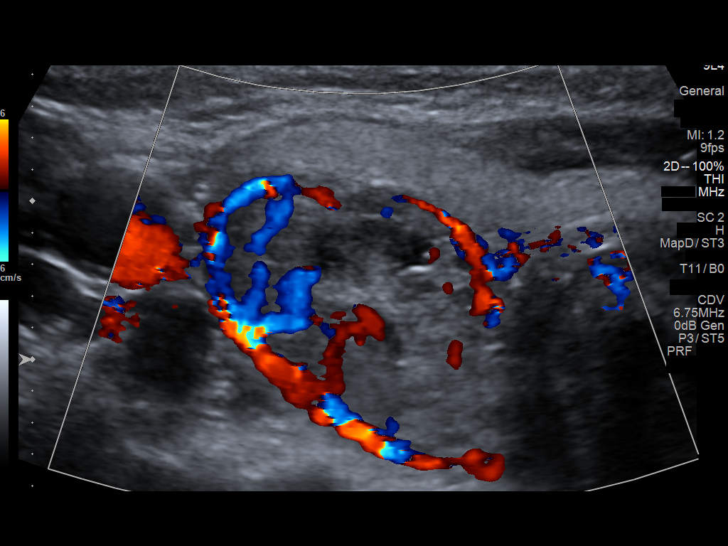
[im 6/13]
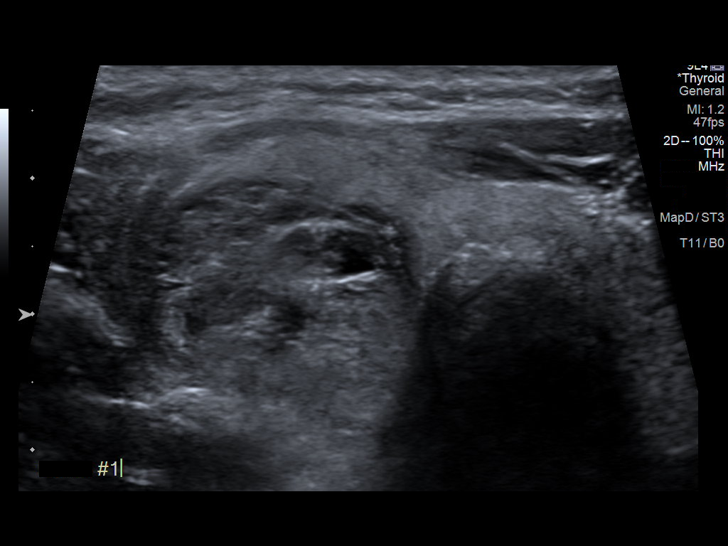
[im 7/13]
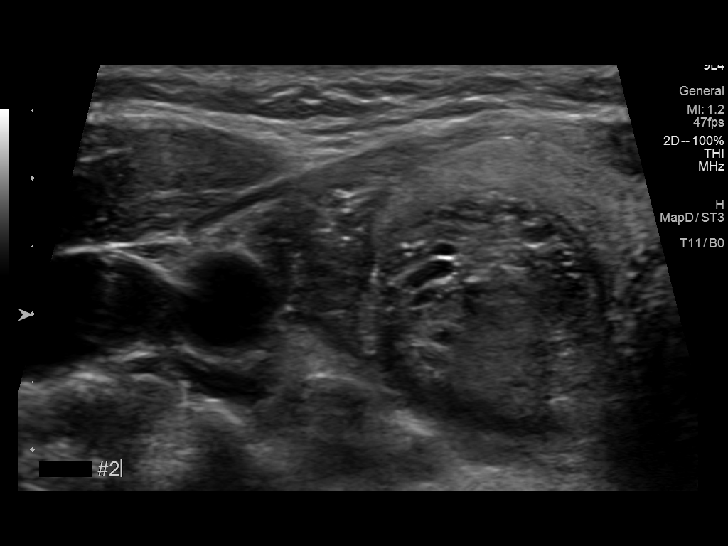
[im 8/13]
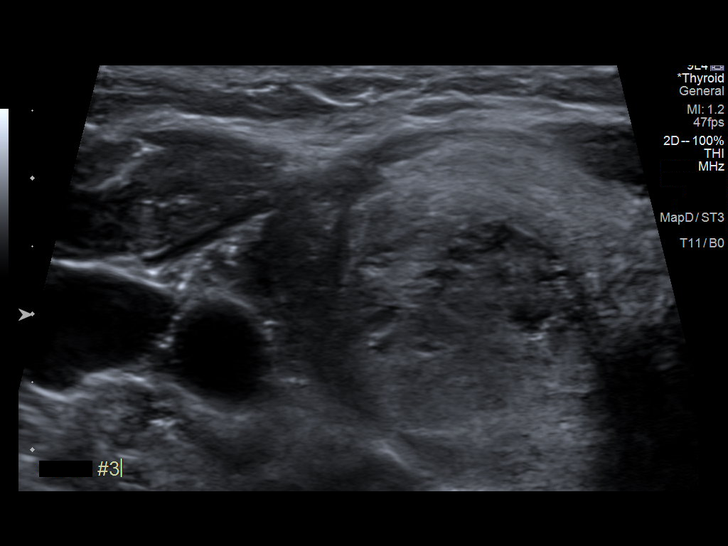
[im 9/13]
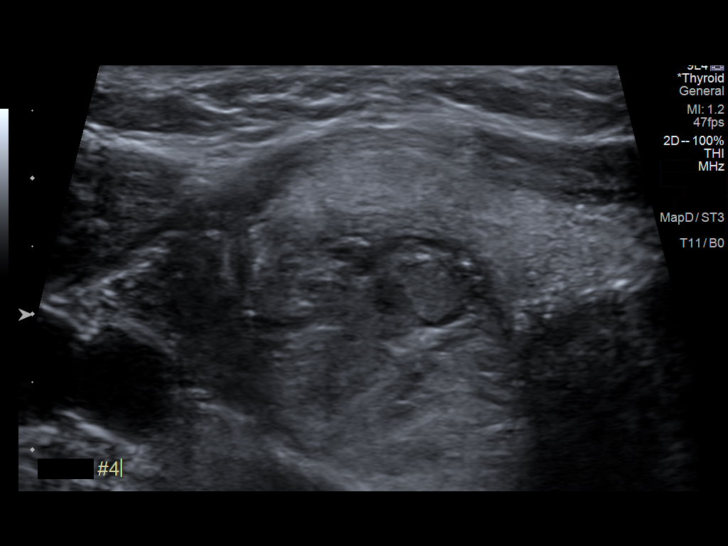
[im 10/13]
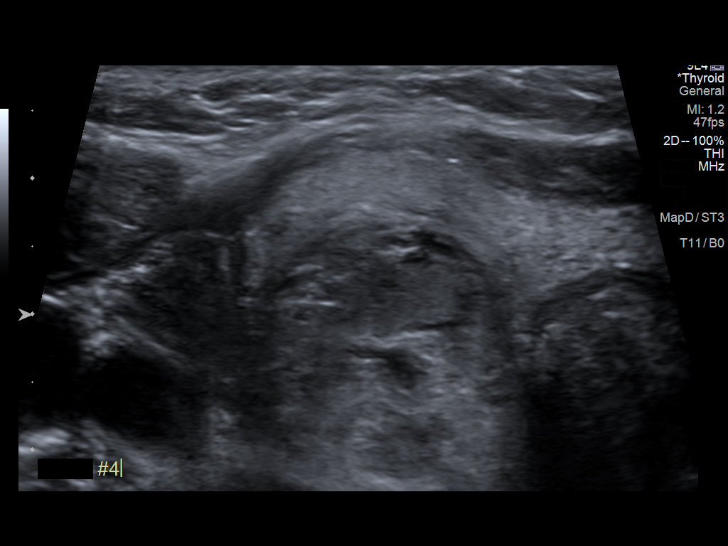
[im 11/13]
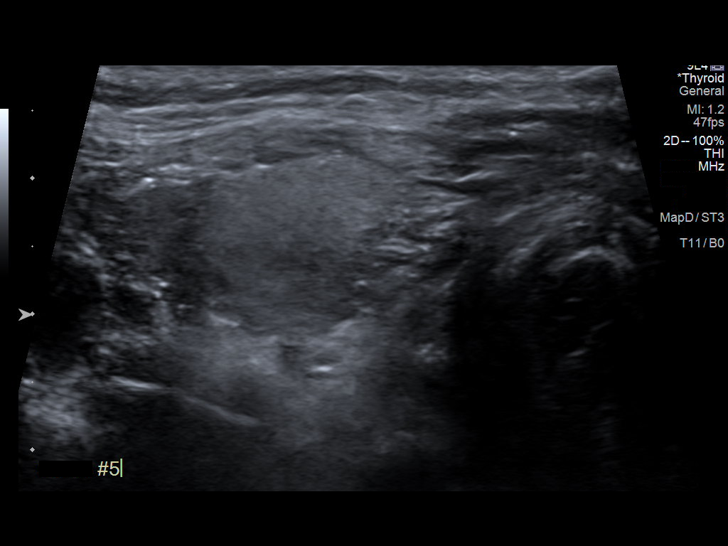
[im 12/13]
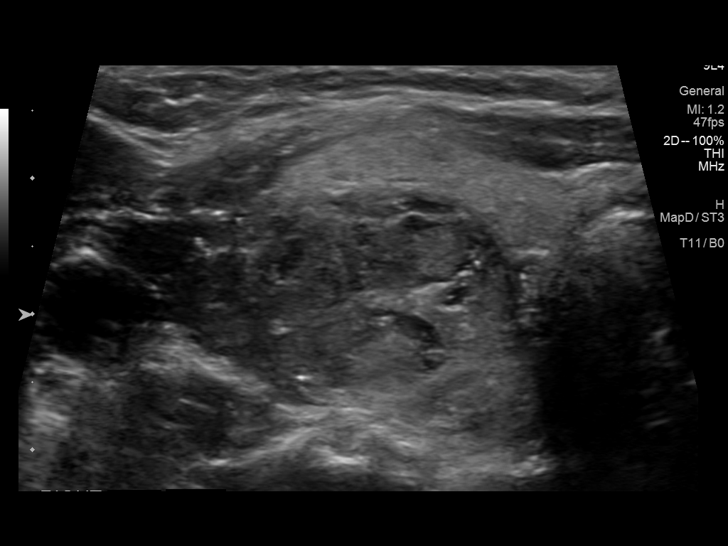
[im 13/13]
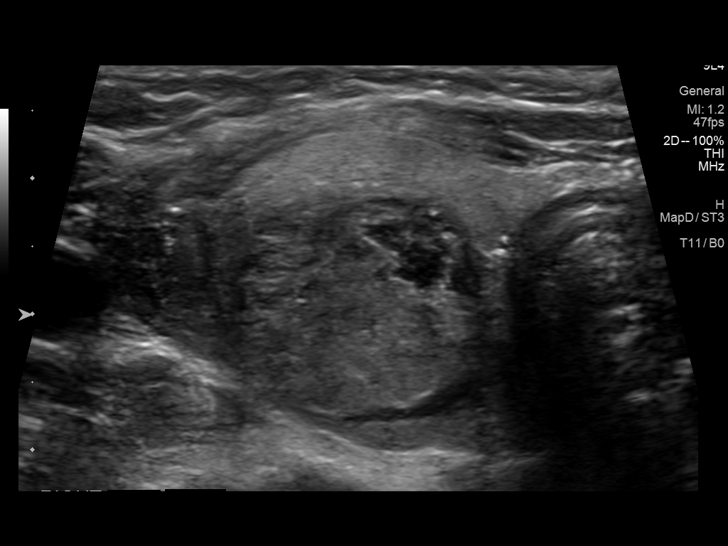

[13 of 13 positions shown; findings below may reference images not displayed]

MEDICATIONS:
None

COMPLICATIONS:
SIR Level A - No therapy, no consequence. Mild bleeding around the
thyroid
Pre-procedural ultrasound scanning demonstrated unchanged size and
appearance of the indeterminate nodule within the right lobe of the
thyroid

The procedure was planned. The neck was prepped in the usual sterile
fashion, and a sterile drape was applied covering the operative
field. A timeout was performed prior to the initiation of the
procedure. Local anesthesia was provided with 1% lidocaine.

Under direct ultrasound guidance, 5 FNA biopsies were performed of
the nodule with a 25 gauge needle. Multiple ultrasound images were
saved for procedural documentation purposes. The samples were
prepared and submitted to pathology.

Limited post procedural scanning demonstrated mild bleeding.
Dressings were placed. The patient tolerated the above procedures
procedure well without immediate postprocedural complication.
FINDINGS: Nodule reference number based on prior diagnostic ultrasound: 1

Maximum size: 2.8 cm

Location: Right; mid

ACR TI-RADS risk category: TR4 (4-6 points)

Reason for biopsy: meets ACR TI-RADS criteria

Ultrasound imaging confirms appropriate placement of the needles
within the thyroid nodule.
IMPRESSION: Technically successful ultrasound guided fine needle aspiration of
thyroid nodule in the right middle lobe as described. Mild
peri-thyroid bleeding occurred.
